# Patient Record
Sex: Male | Born: 1990 | Race: Black or African American | Hispanic: No | Marital: Single | Smoking: Former smoker
Health system: Southern US, Community
[De-identification: ages and names within clinical notes are randomized; demographics above are authoritative.]

## PROBLEM LIST (undated history)

## (undated) DIAGNOSIS — J45909 Unspecified asthma, uncomplicated: Secondary | ICD-10-CM

## (undated) DIAGNOSIS — E119 Type 2 diabetes mellitus without complications: Secondary | ICD-10-CM

## (undated) DIAGNOSIS — I10 Essential (primary) hypertension: Secondary | ICD-10-CM

## (undated) HISTORY — PX: EYE SURGERY: SHX253

---
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
## 2011-04-14 NOTE — Discharge Summary (Signed)
[place]. [facility]  Patient:    Keith Maynard, Keith Maynard                    MRN: [id number] Adm. Date:  [id number] Disc. Date: [id number] Attending:  McDiarmid, Leighton Roach. Dictator:   Cheree Ditto, M.D. CC:         Keith Maynard, M.D.   Discharge Summary  DISCHARGE DIAGNOSES: 1. Asthma exacerbation, improved. 2. Hyperglycemia, resolved. 3. Mental status changes, resolved.  DISCHARGE MEDICATIONS: 1. Flovent 44 mcg two puffs b.i.d. 2. Albuterol one to two puffs q.6h. p.r.n.  PROCEDURES:  None.  CONSULTS:  None.  HISTORY AND PHYSICAL:  Please see dictated history and physical from December 01, 2000.  In short, Keith Maynard is a 20-year-old African-American male with a history of asthma on chronic inhaled corticosteroids who presented to the emergency department with difficulty breathing and a question of unresponsiveness.  HOSPITAL COURSE:  #1 - ASTHMA EXACERBATION:  Keith Maynard received Solu-Medrol in the emergency department.  He was continued on his Flovent inhalers during hospitalization with albuterol nebulizers p.r.n.  He had good room air oxygen saturations on the day of discharge.  It was felt like his breathing was at its baseline.  Because he was doing so well, he was not continued on steroids during hospitalization or upon discharge.  He was instructed to use Flovent two puffs twice a day until follow up with his primary care physician in one to two weeks.  #2 - HYPERGLYCEMIA:  Keith Maynard had a blood glucose of 281 on admission.  This was felt to be secondary to the steroids that he received in the ER as well as the stress of his illness.  CBGs were followed during hospitalization and they normalized.  The morning of his discharge his CBG was 91.  He had no signs of DKA.  #3 - MENTAL STATUS CHANGES:  Keith Maynard presented with altered responsiveness but this quickly improved with treatment of his asthma exacerbation and he was acting like his normal self on  the day of hospital discharge.  DISPOSITION AND FOLLOW UP:  The patient was discharged in much improved condition with follow up planned with Dr. Mikel Cella at [facility] in one to two weeks. DD:  12/02/00 TD:  12/02/00 Job: [id number]

## 2011-04-14 NOTE — H&P (Signed)
[place]. [facility]  Patient:    Keith Maynard, Keith Maynard                      MRN: [id number] Adm. Date:  12/01/00 Attending:  Gaynell Face L. Deirdre Priest, M.D. Dictator:   Talmage Nap, M.D.                         History and Physical  ADMISSION DIAGNOSES:  1. Asthma exacerbation.  2. Question of hyperglycemia.  HISTORY OF PRESENT ILLNESS: Alferd is a very nice [age]-year-old male with a history of asthma since birth.  He has been treated at [facility].  The father reports the patient had multiple hospitalizations when he was young but has had three since the age of [age].  The father reports that approximately one week ago Markale began developing cold symptoms.  He was having a lot of rhinorrhea and had a dry cough.  On Wednesday Nasim developed some worsening of his asthma and was having difficulty breathing with significant shortness of breath and continued dry cough.  At school he was unable to use his MDIs and he was sent home.  Since then he has been receiving albuterol nebulizers at home about every six hours.  Today Keymon developed significantly worsening shortness of breath this evening.  The father reports Kymir was using albuterol nebulizers q.4h without relief.  At approximately midnight the patient developed significant worsening of shortness of breath and he was getting extremely lethargic, and was thought to be minimally responsive by EMS at approximately 1:30 a.m.  En route EMS reports that Vladimir was minimally responsive to pain.  He was given an epinephrine subcu 1:1000 injection.  He had some relief with this and became more responsive.  He was then given an epinephrine nebulizer with continued improvement.  The patients oxygen saturation was originally 78% on room air and improved to 95% on face mask upon arrival.  The father and Red report no significant fever since Wednesday.  He has had some chills.  He has  had significant shortness of breath and increased work of breathing.  He did vomit one time on Wednesday.  He has not had any diarrhea.  The father reports Charod has had good p.o. intake.  Shammond reports he is somewhat shaky currently but feels like he is breathing better.  PAST MEDICAL HISTORY:  1. Premature delivery.  2. Asthma.  3. Hospitalized multiple times in the past without intubation.  MEDICATIONS:  1. Albuterol nebulizer q.4h p.r.n.  2. Flovent 44 mcg 2 puffs q.d.  3. Over-the-counter cold medicine.  ALLERGIES: No known drug allergies.  SOCIAL HISTORY: Sebastyan lives with his father and 65 year old sister.  He is currently in third grade.  His father does smoke at home.  FAMILY HISTORY: Negative for asthma.  PHYSICAL EXAMINATION:  VITAL SIGNS: Currently temperature is 97.3 degrees.  Blood pressure 119/82, heart rate 117, respiratory rate 36.  Oxygen saturation 100% on 10 liter face mask.  Current oxygen saturation 96% on 2 liters nasal cannula oxygen.  GENERAL: The patient is a somewhat tired and quiet male, who is in no distress but is not speaking much.  HEENT: PERRL.  EOMI.  No conjunctival injection.  TMs are clear bilaterally. Mouth reveals slightly dry mucous membranes but the throat is pink without erythema or exudate.  NECK: Supple, with mild shotty adenopathy bilaterally.  There is no thyromegaly.  CHEST: Good aeration, with end  expiratory wheezes diffusely.  No crackles or rhonchi.  CARDIOVASCULAR: Slightly tachycardic rate, with normal rhythm.  There is no murmur noted.  ABDOMEN: Slightly overweight, with normal bowel sounds.  No significant tenderness or distention.  No hepatosplenomegaly.  No masses.  EXTREMITIES: No clubbing, cyanosis, or edema.  Capillary refill time two seconds.  NEUROLOGIC: The patient is a tired but responsive male, who will answer questions with one word; otherwise there are no focal deficits.  LABORATORY DATA: WBC  10.2, hemoglobin 13.8; platelets 396,000; ANC 4.6.  On an i-STAT sodium was 141, potassium 4.5, chloride 106, bicarbonate 27, BUN 13, creatinine 1.2, glucose 281.  Chest x-ray reveals hyperinflation.  ASSESSMENT/PLAN:  1. Asthma exacerbation.  The patient presents with severe exacerbation and     decreased responsiveness with hypoxia per EMS.  The patient currently     appears stable.  He was given epinephrine subcu and nebulizer treatment.     The patient was given Solu-Medrol and albuterol here in the emergency     department and we will continue q.2h albuterol nebulizers.  We will hold     on further steroids given hyperglycemia on questionable i-STAT and will     check an arterial blood gas.  2. Hyperglycemia.  The patients sugar was 281 on i-STAT.  We will check a     CBG now and will check a stat arterial blood gas, CMET, serum ketones, and     urine.  If the patient does continue to be hyperglycemic we will need to     check a C peptide and a hemoglobin A1C to determine the type and     significant amount of hyperglycemia he does have.  3. Question renal dysfunction.  The patients creatinine was 1.2 on i-STAT.     We will check a CMET to reevaluate this. DD:  12/01/00 TD:  12/01/00 Job: [id number]

## 2011-05-14 ENCOUNTER — Observation Stay (HOSPITAL_COMMUNITY)
Admission: EM | Admit: 2011-05-14 | Discharge: 2011-05-14 | Disposition: A | Discharge status: Home / Self Care | Payer: Medicaid Other | Attending: Emergency Medicine | Admitting: Emergency Medicine

## 2011-05-14 DIAGNOSIS — J45909 Unspecified asthma, uncomplicated: Principal | ICD-10-CM | POA: Insufficient documentation

## 2014-02-07 ENCOUNTER — Encounter (HOSPITAL_COMMUNITY): Payer: Self-pay | Admitting: Emergency Medicine

## 2014-02-07 DIAGNOSIS — J45909 Unspecified asthma, uncomplicated: Secondary | ICD-10-CM | POA: Insufficient documentation

## 2014-02-07 DIAGNOSIS — Y929 Unspecified place or not applicable: Secondary | ICD-10-CM | POA: Insufficient documentation

## 2014-02-07 DIAGNOSIS — T628X1A Toxic effect of other specified noxious substances eaten as food, accidental (unintentional), initial encounter: Secondary | ICD-10-CM | POA: Insufficient documentation

## 2014-02-07 DIAGNOSIS — Z79899 Other long term (current) drug therapy: Secondary | ICD-10-CM | POA: Insufficient documentation

## 2014-02-07 DIAGNOSIS — J3489 Other specified disorders of nose and nasal sinuses: Secondary | ICD-10-CM | POA: Insufficient documentation

## 2014-02-07 DIAGNOSIS — R109 Unspecified abdominal pain: Secondary | ICD-10-CM | POA: Insufficient documentation

## 2014-02-07 DIAGNOSIS — Y9389 Activity, other specified: Secondary | ICD-10-CM | POA: Insufficient documentation

## 2014-02-07 DIAGNOSIS — R110 Nausea: Secondary | ICD-10-CM | POA: Insufficient documentation

## 2014-02-07 NOTE — ED Notes (Signed)
Pt. reports allergic reaction after eating nuts this evening states nasal congestion , headache and abdominal cramping , no nausea or vomitting , airway intact / respirations unlabored .

## 2014-02-08 ENCOUNTER — Emergency Department (HOSPITAL_COMMUNITY)
Admission: EM | Admit: 2014-02-08 | Discharge: 2014-02-08 | Disposition: A | Discharge status: Home / Self Care | Payer: Medicaid Other | Attending: Emergency Medicine | Admitting: Emergency Medicine

## 2014-02-08 DIAGNOSIS — T7840XA Allergy, unspecified, initial encounter: Secondary | ICD-10-CM

## 2014-02-08 MED ORDER — DIPHENHYDRAMINE HCL 25 MG PO TABS: 25.0000 mg | ORAL_TABLET | Freq: Four times a day (QID) | ORAL | Status: DC | PRN

## 2014-02-08 MED ORDER — METHYLPREDNISOLONE SODIUM SUCC 125 MG IJ SOLR
125.0000 mg | Freq: Once | INTRAMUSCULAR | Status: AC
  Administered 2014-02-08: 125 mg via INTRAVENOUS
  Filled 2014-02-08: qty 2

## 2014-02-08 MED ORDER — SODIUM CHLORIDE 0.9 % IV BOLUS (SEPSIS)
1000.0000 mL | Freq: Once | INTRAVENOUS | Status: AC
  Administered 2014-02-08: 1000 mL via INTRAVENOUS

## 2014-02-08 MED ORDER — ONDANSETRON HCL 4 MG/2ML IJ SOLN
4.0000 mg | Freq: Once | INTRAMUSCULAR | Status: AC
  Administered 2014-02-08: 4 mg via INTRAVENOUS
  Filled 2014-02-08: qty 2

## 2014-02-08 MED ORDER — EPINEPHRINE 0.3 MG/0.3ML IJ SOAJ
0.3000 mg | Freq: Once | INTRAMUSCULAR | Status: DC | PRN
Start: 2014-02-08 — End: 2015-09-15

## 2014-02-08 MED ORDER — DIPHENHYDRAMINE HCL 50 MG/ML IJ SOLN
25.0000 mg | Freq: Once | INTRAMUSCULAR | Status: AC
  Administered 2014-02-08: 25 mg via INTRAVENOUS
  Filled 2014-02-08: qty 1

## 2014-02-08 NOTE — ED Provider Notes (Signed)
CSN: [id number]     Arrival date & time 02/07/14  2319 History   First MD Initiated Contact with Patient 02/08/14 0025     Chief Complaint  Patient presents with  . Allergic Reaction     (Consider location/radiation/quality/duration/timing/severity/associated sxs/prior Treatment) HPI Comments: Patient is [name] PMHx significant for asthma and allergies presenting to the ED for an allergic reaction after accidentally eating a pecan about an hour PTA. Patient states he developed nasal congestion, rhinorrhea, abdominal cramping, nausea. The patient states that he did not take anything for his symptoms, but they have improved drastically while being in ED. He does still endorse some mild nausea and rhinorrhea, but denies any SOB, oropharyngeal swelling/edema, drooling, stridor. Patient states he was hospitalized in high school for an allergic reaction, but did not require intubation. He does not have an EpiPen at home.   Patient is a 23 y.o. male presenting with allergic reaction.  Allergic Reaction Presenting symptoms: no difficulty swallowing and no wheezing     History reviewed. No pertinent past medical history. History reviewed. No pertinent past surgical history. No family history on file. History  Substance Use Topics  . Smoking status: Never Smoker   . Smokeless tobacco: Not on file  . Alcohol Use: No    Review of Systems  Constitutional: Negative for fever and chills.  HENT: Positive for congestion, rhinorrhea and sneezing. Negative for facial swelling, sore throat, trouble swallowing and voice change.   Respiratory: Negative for shortness of breath, wheezing and stridor.   Gastrointestinal: Positive for nausea and abdominal pain. Negative for vomiting.  All other systems reviewed and are negative.      Allergies  Other  Home Medications   Current Outpatient Rx  Name  Route  Sig  Dispense  Refill  . albuterol (PROVENTIL,VENTOLIN) 90 MCG/ACT inhaler      2 puffs  every 4 hours as needed for cough and wheezing and shortness of breath           BP 126/82  Pulse 65  Temp(Src) 98.3 F (36.8 C) (Oral)  Resp 14  Ht 5\' 7"  (1.702 m)  Wt 125 lb (56.7 kg)  BMI 19.57 kg/m2  SpO2 97% Physical Exam  Constitutional: He is oriented to person, place, and time. He appears well-developed and well-nourished. No distress.  HENT:  Head: Normocephalic and atraumatic.  Right Ear: External ear normal.  Left Ear: External ear normal.  Nose: Nose normal.  Mouth/Throat: Uvula is midline, oropharynx is clear and moist and mucous membranes are normal. No trismus in the jaw. No uvula swelling. No oropharyngeal exudate, posterior oropharyngeal edema, posterior oropharyngeal erythema or tonsillar abscesses.  No angioedema.   Eyes: Conjunctivae are normal.  Neck: Normal range of motion. Neck supple.  Cardiovascular: Normal rate, regular rhythm, normal heart sounds and intact distal pulses.   Pulmonary/Chest: Effort normal and breath sounds normal. No accessory muscle usage or stridor. Not tachypneic and not bradypneic. No respiratory distress. He has no decreased breath sounds. He has no wheezes. He has no rhonchi. He has no rales. He exhibits no tenderness.  Abdominal: Soft. Bowel sounds are normal. He exhibits no distension. There is no tenderness. There is no rebound and no guarding.  Musculoskeletal: Normal range of motion.  Lymphadenopathy:    He has no cervical adenopathy.  Neurological: He is alert and oriented to person, place, and time.  Skin: Skin is warm and dry. He is not diaphoretic.  Psychiatric: He has a normal  mood and affect.    ED Course  Procedures (including critical care time) Medications  sodium chloride 0.9 % bolus 1,000 mL (1,000 mLs Intravenous New Bag/Given 02/08/14 0056)  ondansetron (ZOFRAN) injection 4 mg (4 mg Intravenous Given 02/08/14 0054)  methylPREDNISolone sodium succinate (SOLU-MEDROL) 125 mg/2 mL injection 125 mg (125 mg  Intravenous Given 02/08/14 0054)  diphenhydrAMINE (BENADRYL) injection 25 mg (25 mg Intravenous Given 02/08/14 0054)    Labs Review Labs Reviewed - No data to display Imaging Review No results found.   EKG Interpretation None      MDM   Final diagnoses:  Allergic reaction    Filed Vitals:   02/07/14 2330  BP: 126/82  Pulse: 65  Temp: 98.3 F (36.8 C)  Resp: 14    No evidence of facial, oropharyngeal swelling on initial assessment. Lungs clear. RRR nml S1S2. Denies any SOB, stridor, wheezing. Endorses mild nausea and rhinorrhea still. IVF, Solumedrol, Benadryl and Zofran ordered. Plan to watch patient for any rebound reaction, disposition pending how patient does with hopeful DC home. Patient d/w with Dr. Norlene Campbelltter, agrees with plan and who will reassess patient.      Jeannetta EllisJennifer L Shanell Aden, PA-C 02/08/14 0158

## 2014-02-08 NOTE — ED Notes (Signed)
Pt denies nausea, difficulty breathing. States some symptoms have resolved. Scratchy throat gone, nausea resolved. Dull pain in stomach left.

## 2014-02-08 NOTE — ED Provider Notes (Signed)
Medical screening examination/treatment/procedure(s) were performed by non-physician practitioner and as supervising physician I was immediately available for consultation/collaboration.   EKG Interpretation None       Dan Dissinger M Cornelis Kluver, MD 02/08/14 0227 

## 2014-02-08 NOTE — ED Notes (Signed)
PA at bedside.

## 2014-02-08 NOTE — Discharge Instructions (Signed)
Please follow up with your primary care physician in 1-2 days. If you do not have one please call the [place] number listed above. Please use EpiPen as directed below. Please take Benadryl as rpescribed as needed. Please read all discharge instructions and return precautions.    Epinephrine injection (Auto-injector) What is this medicine? EPINEPHRINE (ep i NEF rin) is used for the emergency treatment of severe allergic reactions. You should keep this medicine with you at all times. This medicine may be used for other purposes; ask your health care provider or pharmacist if you have questions. COMMON BRAND NAME(S): Adrenaclick, Auvi-Q, EpiPen, Twinject What should I tell my health care provider before I take this medicine? They need to know if you have any of the following conditions: -diabetes -heart disease -high blood pressure -lung or breathing disease, like asthma -Parkinson's disease -thyroid disease -an unusual or allergic reaction to epinephrine, sulfites, other medicines, foods, dyes, or preservatives -pregnant or trying to get pregnant -breast-feeding How should I use this medicine? This medicine is for injection into the outer thigh. Your doctor or health care professional will instruct you on the proper use of the device during an emergency. Read all directions carefully and make sure you understand them. Do not use more often than directed. Talk to your pediatrician regarding the use of this medicine in children. Special care may be needed. This drug is commonly used in children. A special device is available for use in children. Overdosage: If you think you have taken too much of this medicine contact a poison control center or emergency room at once. NOTE: This medicine is only for you. Do not share this medicine with others. What if I miss a dose? This does not apply. You should only use this medicine for an allergic reaction. What may interact with  this medicine? This medicine is only used during an emergency. Significant drug interactions are not likely during emergency use. This list may not describe all possible interactions. Give your health care provider a list of all the medicines, herbs, non-prescription drugs, or dietary supplements you use. Also tell them if you smoke, drink alcohol, or use illegal drugs. Some items may interact with your medicine. What should I watch for while using this medicine? Keep this medicine ready for use in the case of a severe allergic reaction. Make sure that you have the phone number of your doctor or health care professional and local hospital ready. Remember to check the expiration date of your medicine regularly. You may need to have additional units of this medicine with you at work, school, or other places. Talk to your doctor or health care professional about your need for extra units. Some emergencies may require an additional dose. Check with your doctor or a health care professional before using an extra dose. After use, go to the nearest hospital or call 911. Avoid physical activity. Make sure the treating health care professional knows you have received an injection of this medicine. You will receive additional instructions on what to do during and after use of this medicine before a medical emergency occurs. What side effects may I notice from receiving this medicine? Side effects that you should report to your doctor or health care professional as soon as possible: -allergic reactions like skin rash, itching or hives, swelling of the face, lips, or tongue -breathing problems -chest pain -flushing -irregular or pounding heartbeat -numbness in fingers or toes -vomiting Side effects that usually do not  require medical attention (report to your doctor or health care professional if they continue or are bothersome): -anxiety or nervousness -dizzy, drowsy -dry mouth -headache -increased  sweating -nausea -tired, weak This list may not describe all possible side effects. Call your doctor for medical advice about side effects. You may report side effects to FDA at 1-800-FDA-1088. Where should I keep my medicine? Keep out of the reach of children. Store at room temperature between 15 and 30 degrees C (59 and 86 degrees F). Protect from light and heat. The solution should be clear in color. If the solution is discolored or contains particles it must be replaced. Throw away any unused medicine after the expiration date. Ask your doctor or pharmacist about proper disposal of the injector if it is expired or has been used. Always replace your auto-injector before it expires. NOTE: This sheet is a summary. It may not cover all possible information. If you have questions about this medicine, talk to your doctor, pharmacist, or health care provider.  2014, Elsevier/Gold Standard. (2013-03-24 14:59:01) Anaphylactic Reaction An anaphylactic reaction is a sudden, severe allergic reaction that involves the whole body. It can be life threatening. A hospital stay is often required. People with asthma, eczema, or hay fever are slightly more likely to have an anaphylactic reaction. CAUSES  An anaphylactic reaction may be caused by anything to which you are allergic. After being exposed to the allergic substance, your immune system becomes sensitized to it. When you are exposed to that allergic substance again, an allergic reaction can occur. Common causes of an anaphylactic reaction include:  Medicines.  Foods, especially peanuts, wheat, shellfish, milk, and eggs.  Insect bites or stings.  Blood products.  Chemicals, such as dyes, latex, and contrast material used for imaging tests. SYMPTOMS  When an allergic reaction occurs, the body releases histamine and other substances. These substances cause symptoms such as tightening of the airway. Symptoms often develop within seconds or minutes of  exposure. Symptoms may include:  Skin rash or hives.  Itching.  Chest tightness.  Swelling of the eyes, tongue, or lips.  Trouble breathing or swallowing.  Lightheadedness or fainting.  Anxiety or confusion.  Stomach pains, vomiting, or diarrhea.  Nasal congestion.  A fast or irregular heartbeat (palpitations). DIAGNOSIS  Diagnosis is based on your history of recent exposure to allergic substances, your symptoms, and a physical exam. Your caregiver may also perform blood or urine tests to confirm the diagnosis. TREATMENT  Epinephrine medicine is the main treatment for an anaphylactic reaction. Other medicines that may be used for treatment include antihistamines, steroids, and albuterol. In severe cases, fluids and medicine to support blood pressure may be given through an intravenous line (IV). Even if you improve after treatment, you need to be observed to make sure your condition does not get worse. This may require a stay in the hospital. [place] a medical alert bracelet or necklace stating your allergy.  You and your family must learn how to use an anaphylaxis kit or give an epinephrine injection to temporarily treat an emergency allergic reaction. Always carry your epinephrine injection or anaphylaxis kit with you. This can be lifesaving if you have a severe reaction.  Do not drive or perform tasks after treatment until the medicines used to treat your reaction have worn off, or until your caregiver says it is okay.  If you have hives or a rash:  Take medicines as directed by your caregiver.  You may  use an over-the-counter antihistamine (diphenhydramine) as needed.  Apply cold compresses to the skin or take baths in cool water. Avoid hot baths or showers. SEEK MEDICAL CARE IF:   You develop symptoms of an allergic reaction to a new substance. Symptoms may start right away or minutes later.  You develop a rash, hives, or itching.  You develop  new symptoms. SEEK IMMEDIATE MEDICAL CARE IF:   You have swelling of the mouth, difficulty breathing, or wheezing.  You have a tight feeling in your chest or throat.  You develop hives, swelling, or itching all over your body.  You develop severe vomiting or diarrhea.  You feel faint or pass out. This is an emergency. Use your epinephrine injection or anaphylaxis kit as you have been instructed. Call your local emergency services (911 in [place] Even if you improve after the injection, you need to be examined at a hospital emergency department. MAKE SURE YOU:   Understand these instructions.  Will watch your condition.  Will get help right away if you are not doing well or get worse. Document Released: 11/13/2005 Document Revised: 05/14/2012 Document Reviewed: 02/14/2012 [facility] Patient Information 2014 [place], [place].

## 2015-04-01 ENCOUNTER — Encounter (HOSPITAL_COMMUNITY): Payer: Self-pay | Admitting: Emergency Medicine

## 2015-04-01 ENCOUNTER — Emergency Department (HOSPITAL_COMMUNITY)
Admission: EM | Admit: 2015-04-01 | Discharge: 2015-04-01 | Disposition: A | Discharge status: Home / Self Care | Payer: Medicaid Other | Attending: Emergency Medicine | Admitting: Emergency Medicine

## 2015-04-01 DIAGNOSIS — R0600 Dyspnea, unspecified: Secondary | ICD-10-CM

## 2015-04-01 DIAGNOSIS — Z7952 Long term (current) use of systemic steroids: Secondary | ICD-10-CM | POA: Insufficient documentation

## 2015-04-01 DIAGNOSIS — J45901 Unspecified asthma with (acute) exacerbation: Secondary | ICD-10-CM

## 2015-04-01 DIAGNOSIS — J45909 Unspecified asthma, uncomplicated: Secondary | ICD-10-CM | POA: Insufficient documentation

## 2015-04-01 DIAGNOSIS — Z79899 Other long term (current) drug therapy: Secondary | ICD-10-CM | POA: Insufficient documentation

## 2015-04-01 HISTORY — DX: Unspecified asthma, uncomplicated: J45.909

## 2015-04-01 MED ORDER — ALBUTEROL SULFATE HFA 108 (90 BASE) MCG/ACT IN AERS
2.0000 | INHALATION_SPRAY | Freq: Once | RESPIRATORY_TRACT | Status: AC
  Administered 2015-04-01: 2 via RESPIRATORY_TRACT
  Filled 2015-04-01: qty 6.7

## 2015-04-01 MED ORDER — PREDNISONE 20 MG PO TABS: 60.0000 mg | ORAL_TABLET | Freq: Every day | ORAL | Status: DC

## 2015-04-01 NOTE — Progress Notes (Signed)
[facility] Keith DutyFelicia Maynard Community Health & Eligibility Specialist [phone]  LCSW Keith RasmussenHannah Maynard consulted me about this patient regarding primary care options and [facility]. Spoke with patient regarding these concerns. Patient and mother at bedside states the patient's medicaid has expired and patient is in need of medication and a primary care provider. Patient states he has been seen in the past and [facility]. I called to see if patient could re-established with the clinic, patient was put on the wait list. Patient will be notified by the practice via mail once an appointment becomes available. [facility] this transition. [facility],2016 at 8:30am to obtain the orange card and apply for financial assistance. Patient verbalized understanding and active in this plan. Orange Physicist, medicalcard application and resource guide provided. Patient also given my contact information for any future questions or concerns. No other needs identified at this time.

## 2015-04-01 NOTE — Discharge Instructions (Signed)

## 2015-04-01 NOTE — Clinical Social Work Note (Signed)
Clinical Social Work Assessment  Patient Details  Name: Keith Maynard MRN: [id number] Date of Birth: June 11, 1991  Date of referral:  04/01/15               Reason for consult:  Financial Concerns, Medication Concerns, Other (Comment Required) (Access to Care)                Permission sought to share information with:  Case Manager, Customer service manager Permission granted to share information::  Yes, Verbal Permission Granted  Name::     patient mother at the bedside  Agency::  [facility], [facility]  Relationship::  Mother, community supports  Sport and exercise psychologist Information:  Yes  Housing/Transportation Living arrangements for the past 2 months:  [place] of Information:  Patient, Parent Patient Interpreter Needed:  None Criminal Activity/Legal Involvement Pertinent to Current Situation/Hospitalization:  No - Comment as needed Significant Relationships:  Parents Lives with:  Parents Do you feel safe going back to the place where you live?  Yes Need for family participation in patient care:  Yes (Comment) (just for additional support)  Care giving concerns:  No care giving concerns at this time, patient has all basic needs met   Social Worker assessment / plan:  LCSW received call and consult from MD regarding access to care and needing insurance to help pay for his medication and have follow up. Patient had medicaid when he was 24 years old, but then aged out of the system and was unable to received medicaid as he applied, but was denied. Mother in patient room reports he was active in follow up, but when he lost his insurance it has been sporadic with follow up. LCSW discussed options regarding [facility], [facility], and [facility].  LCSW obtained permission to have representative from [facility] come and give application and follow up instructions in which they were both receptive. LCSW will speak with CM about [place] letter for his albuterol  as they cannot afford the medication.  Patient has not used this resource this year or in the past.  Patient will DC home with mother as support and no other needs at this time identified.  Employment status:  Unemployed Forensic scientist:  Self Pay (Medicaid Pending) PT Recommendations:  Not assessed at this time, No Follow Up Information / Referral to community resources:  Other (Comment Required) ([facility], [facility])  Patient/Family's Response to care:  Patient and mother both agreeable to referrals for access to care: [facility] and visit from the representative from [facility] in effort to   Patient/Family's Understanding of and Emotional Response to Diagnosis, Current Treatment, and Prognosis:  Mother and patient both agreeable to plan and understanding of follow up with community resources discussed.  Mother reports patient was a premie and has had asthma since birth and struggled. Patient is currently trying to find a job, has been under a lot of stress, causing exacerbation of his asthma and attacks.  Mother hopeful for orange card along with patient and appreciative of referrals.   Emotional Assessment Appearance:  Appears stated age Attitude/Demeanor/Rapport:  Other (Approrpiate and Cooperative) Affect (typically observed):  Accepting, Quiet Orientation:  Oriented to Self, Oriented to Place, Oriented to  Time, Oriented to Situation Alcohol / Substance use:  Not Applicable Psych involvement (Current and /or in the community):  No (Comment)  Discharge Needs  Concerns to be addressed:  Medication Concerns, Financial / Insurance Concerns Readmission within the last  30 days:  No Current discharge risk:  None Barriers to Discharge:  Barriers Resolved   Lilly [place], LCSW 04/01/2015, 8:15 AM

## 2015-04-01 NOTE — ED Provider Notes (Signed)
CSN: [id number]     Arrival date & time 04/01/15  [id number] History   First MD Initiated Contact with Patient 04/01/15 [phone]     Chief Complaint  Patient presents with  . Asthma  . Chest Pain     (Consider location/radiation/quality/duration/timing/severity/associated sxs/prior Treatment) Patient is a 24 y.o. male presenting with asthma.  Asthma This is a new problem. Episode onset: 2 days ago. The problem occurs constantly. The problem has not changed since onset.Nothing aggravates the symptoms. Nothing relieves the symptoms. He has tried nothing for the symptoms.    Past Medical History  Diagnosis Date  . Asthma    History reviewed. No pertinent past surgical history. History reviewed. No pertinent family history. History  Substance Use Topics  . Smoking status: Never Smoker   . Smokeless tobacco: Not on file  . Alcohol Use: No    Review of Systems  All other systems reviewed and are negative.     Allergies  Other  Home Medications   Prior to Admission medications   Medication Sig Start Date End Date Taking? Authorizing Provider  albuterol (PROVENTIL,VENTOLIN) 90 MCG/ACT inhaler 2 puffs every 4 hours as needed for cough and wheezing and shortness of breath    Yes Historical Provider, MD  diphenhydrAMINE (BENADRYL) 25 MG tablet Take 1 tablet (25 mg total) by mouth every 6 (six) hours as needed for itching or allergies. 02/08/14  Yes Jennifer Piepenbrink, PA-C  EPINEPHrine (EPIPEN 2-PAK) 0.3 mg/0.3 mL SOAJ injection Inject 0.3 mLs (0.3 mg total) into the muscle once as needed (for severe allergic reaction). CAll 911 immediately if you have to use this medicine 02/08/14   Francee PiccoloJennifer Piepenbrink, PA-C  predniSONE (DELTASONE) 20 MG tablet Take 3 tablets (60 mg total) by mouth daily. 04/01/15   Mirian MoMatthew Gentry, MD   BP 127/74 mmHg  Pulse 96  Temp(Src) 97.7 F (36.5 C) (Oral)  Resp 14  SpO2 98% Physical Exam  Constitutional: He is oriented to person, place, and time. He appears  well-developed and well-nourished.  HENT:  Head: Normocephalic and atraumatic.  Eyes: Conjunctivae and EOM are normal.  Neck: Normal range of motion. Neck supple.  Cardiovascular: Normal rate, regular rhythm and normal heart sounds.   Pulmonary/Chest: Effort normal and breath sounds normal. No respiratory distress.  Abdominal: He exhibits no distension. There is no tenderness. There is no rebound and no guarding.  Musculoskeletal: Normal range of motion.  Neurological: He is alert and oriented to person, place, and time.  Skin: Skin is warm and dry.  Vitals reviewed.   ED Course  Procedures (including critical care time) Labs Review Labs Reviewed - No data to display  Imaging Review No results found.   EKG Interpretation   Date/Time:  Thursday Apr 01 2015 07:45:12 EDT Ventricular Rate:  97 PR Interval:  146 QRS Duration: 96 QT Interval:  353 QTC Calculation: 448 R Axis:   59 Text Interpretation:  Sinus rhythm No significant change since last  tracing Confirmed by Mirian MoGentry, Matthew [phone]([id number]) on 04/01/2015 9:08:00 AM      MDM   Final diagnoses:  Dyspnea  Asthma attack    24 y.o. male with pertinent PMH of asthma presents with dyspnea, wheezing, chest tightness from wheezing consistent with prior asthma attack. No fevers, productive cough, or other signs of PNA or infection.  Pt has very mild dyspnea at time of exam.  Given albuterol.  Social work consulted as pt medicaid lapsed and he has no way of affording albuterol.  Social work has arranged for fu.  DC home in stable condition with prescription for prednisone.  I have reviewed all laboratory and imaging studies if ordered as above  1. Dyspnea   2. Asthma attack         Mirian MoMatthew Gentry, MD 04/01/15 [phone]

## 2015-04-01 NOTE — Discharge Planning (Signed)
Talked to pt at bedside. Chart reviewed. Spoke with pt concerning his medications and not being able to afford them.  Explained the MATCH- Medication Assistance program to pt. [facility] is only a one time in one year from the date of discharge to next year. Pt chose to decline MATCH at this time; [facility] or [facility] and apply for [employer].

## 2015-04-01 NOTE — ED Notes (Addendum)
Pt as asthma but no doctor; has rescue inhaler but no breathing tx machine at home. States he has had 5 today. Sats 100% and no signs of respiratory distress. Chest pain associated with asthma attack. Pain resolves after attack. Flair up of asthma started yesterday. Reports he is out of inhaler too. Family wants to talk with SW about getting provider and some medicine.

## 2015-09-15 ENCOUNTER — Ambulatory Visit (INDEPENDENT_AMBULATORY_CARE_PROVIDER_SITE_OTHER): Payer: Self-pay | Admitting: Family Medicine

## 2015-09-15 VITALS — BP 116/80 | HR 94 | Temp 97.8°F | Resp 16 | Ht 68.0 in | Wt 119.0 lb

## 2015-09-15 DIAGNOSIS — J0110 Acute frontal sinusitis, unspecified: Secondary | ICD-10-CM

## 2015-09-15 MED ORDER — AMOXICILLIN-POT CLAVULANATE 875-125 MG PO TABS: 1.0000 | ORAL_TABLET | Freq: Two times a day (BID) | ORAL | Status: DC

## 2015-09-15 MED ORDER — PREDNISONE 20 MG PO TABS: ORAL_TABLET | ORAL | Status: DC

## 2015-09-15 MED ORDER — PHENYLEPH-PROMETHAZINE-COD 5-6.25-10 MG/5ML PO SYRP: 10.0000 mL | ORAL_SOLUTION | Freq: Four times a day (QID) | ORAL | Status: DC | PRN

## 2015-09-15 NOTE — Patient Instructions (Signed)

## 2015-09-15 NOTE — Progress Notes (Signed)
Subjective:  This chart was scribed for Keith Sorenson, MD by Stann Ore, Medical Scribe. This patient was seen in Room 11 and the patient's care was started 2:28 PM.    Patient ID: Keith Maynard, male    DOB: July 26, 1991, 24 y.o.   MRN: [id number] Chief Complaint  Patient presents with  . Sinus Problem    x 1 week   . Headache    x 3 days     HPI Keith Maynard is a 24 y.o. male who presents to [facility] complaining of sinus problems for a week now.  He also noticed a headaches located behind his left eye starting 3 days ago. He used aspirin for his headaches for mild relief. He used nasal spray for temporary relief. He has lack of sleep. He denies previous sinus infections. He's only needing to use his inhaler once. He denies being on antibiotics recently. He denies knowledge of antibiotic allergies. He denies fever, chills, wheezes.   He has work later in the afternoon. He works in [employer].   Past Medical History  Diagnosis Date  . Asthma    Prior to Admission medications   Medication Sig Start Date End Date Taking? Authorizing Provider  albuterol (PROVENTIL,VENTOLIN) 90 MCG/ACT inhaler 2 puffs every 4 hours as needed for cough and wheezing and shortness of breath    Yes Historical Provider, MD   Allergies  Allergen Reactions  . Other Shortness Of Breath    All nuts except peanuts-itching,nausea    Review of Systems  Constitutional: Negative for fever and chills.  HENT: Positive for rhinorrhea and sinus pressure.   Respiratory: Negative for cough and wheezing.   Cardiovascular: Negative for chest pain.  Gastrointestinal: Negative for nausea, vomiting, diarrhea and constipation.  Neurological: Positive for headaches. Negative for dizziness.  Psychiatric/Behavioral: Positive for sleep disturbance.       Objective:   Physical Exam  Constitutional: He is oriented to person, place, and time. He appears well-developed and well-nourished. No distress.  HENT:  Head:  Normocephalic and atraumatic.  Right Ear: A middle ear effusion is present.  Left Ear: A middle ear effusion is present.  Nose: Rhinorrhea present.  Severe nasal rhinitis  Eyes: EOM are normal. Pupils are equal, round, and reactive to light.  Neck: Neck supple.  Cardiovascular: Regular rhythm, S1 normal and S2 normal.  Tachycardia present.   Pulmonary/Chest: Effort normal and breath sounds normal. No respiratory distress. He has no wheezes.  Musculoskeletal: Normal range of motion.  Neurological: He is alert and oriented to person, place, and time.  Skin: Skin is warm and dry.  Psychiatric: He has a normal mood and affect. His behavior is normal.  Nursing note and vitals reviewed.   BP 116/80 mmHg  Pulse 94  Temp(Src) 97.8 F (36.6 C) (Oral)  Resp 16  Ht  (1.727 m)  Wt 119 lb (53.978 kg)  BMI 18.10 kg/m2  SpO2 99%       Assessment & Plan:  By signing my name below, I, Stann Ore, attest that this documentation has been prepared under the direction and in the presence of Keith Sorenson, MD. Electronically Signed: Stann Ore, Scribe. 09/15/2015 , 2:28 PM .  1. Acute frontal sinusitis, recurrence not specified     Meds ordered this encounter  Medications  . DISCONTD: amoxicillin-clavulanate (AUGMENTIN) 875-125 MG tablet    Sig: Take 1 tablet by mouth 2 (two) times daily.    Dispense:  20 tablet  Refill:  0  . Phenyleph-Promethazine-Cod (PROMETHAZINE VC/CODEINE) 5-6.25-10 MG/5ML SYRP    Sig: Take 10 mLs by mouth every 6 (six) hours as needed (headache, cough, congestion, sleep).    Dispense:  180 mL    Refill:  0    Ok to dispense the same sig and amount without the phenyleph if only promethazine-cod syrup is avail  . DISCONTD: predniSONE (DELTASONE) 20 MG tablet    Sig: 3 tabs po qd x 3d, 2 tabs po qd x 3d, 1 tab po qd x 3d    Dispense:  18 tablet    Refill:  0  . amoxicillin-clavulanate (AUGMENTIN) 875-125 MG tablet    Sig: Take 1 tablet by mouth 2 (two)  times daily.    Dispense:  20 tablet    Refill:  0  . predniSONE (DELTASONE) 20 MG tablet    Sig: 3 tabs po qd x 3d, 2 tabs po qd x 3d, 1 tab po qd x 3d    Dispense:  18 tablet    Refill:  0    I personally performed the services described in this documentation, which was scribed in my presence. The recorded information has been reviewed and considered, and addended by me as needed.  Keith SorensonEva Shaw, MD MPH

## 2015-09-16 ENCOUNTER — Telehealth: Payer: Self-pay

## 2015-09-16 NOTE — Telephone Encounter (Signed)
Can we replace or possibly check database to make sure Rx was not filled. Please advise on note until Sunday.

## 2015-09-16 NOTE — Telephone Encounter (Signed)
Patient needs OOW note to return on Sunday.  Saw Dr. Clelia CroftShaw yesterday.   Lost the prescription for Phenyleph-Promethazine-Cod (PROMETHAZINE VC/CODEINE) 5-6.25-10 MG/5ML SYRP Can it be called in or does he need to come in to pick up .  [facility] [address] Call patient and let him know about both. PLEASE  [phone]

## 2015-09-16 NOTE — Telephone Encounter (Signed)
Rx sent. Pt notified. Note in pick up draw.

## 2015-09-16 NOTE — Telephone Encounter (Signed)
Yes, fine to replace - it is a very mild med so don't worry about database. This CAN be called in so that would be great. Yes, fine for the work note - whatever pt needs is fine.  If the pharmacy does not have this specific cough syrup - ok to substitute for the same promethazine-codeine without the phenylephrine - same dosing, sig, amount.

## 2015-12-03 ENCOUNTER — Other Ambulatory Visit: Payer: Self-pay | Admitting: Infectious Disease

## 2015-12-03 ENCOUNTER — Ambulatory Visit
Admission: RE | Admit: 2015-12-03 | Discharge: 2015-12-03 | Disposition: A | Discharge status: Home / Self Care | Payer: No Typology Code available for payment source | Source: Ambulatory Visit | Attending: Infectious Disease | Admitting: Infectious Disease

## 2015-12-03 DIAGNOSIS — R7611 Nonspecific reaction to tuberculin skin test without active tuberculosis: Secondary | ICD-10-CM

## 2016-01-04 ENCOUNTER — Other Ambulatory Visit: Payer: Self-pay | Admitting: Infectious Disease

## 2016-01-04 ENCOUNTER — Ambulatory Visit
Admission: RE | Admit: 2016-01-04 | Discharge: 2016-01-04 | Disposition: A | Discharge status: Home / Self Care | Payer: No Typology Code available for payment source | Source: Ambulatory Visit | Attending: Infectious Disease | Admitting: Infectious Disease

## 2016-01-04 DIAGNOSIS — R7611 Nonspecific reaction to tuberculin skin test without active tuberculosis: Secondary | ICD-10-CM

## 2018-02-22 ENCOUNTER — Encounter (INDEPENDENT_AMBULATORY_CARE_PROVIDER_SITE_OTHER): Payer: Self-pay | Admitting: Physician Assistant

## 2018-02-22 ENCOUNTER — Other Ambulatory Visit: Payer: Self-pay

## 2018-02-22 ENCOUNTER — Ambulatory Visit (INDEPENDENT_AMBULATORY_CARE_PROVIDER_SITE_OTHER): Payer: PRIVATE HEALTH INSURANCE | Admitting: Physician Assistant

## 2018-02-22 VITALS — BP 176/102 | HR 98 | Temp 98.1°F | Ht 67.0 in | Wt 127.0 lb

## 2018-02-22 DIAGNOSIS — J4520 Mild intermittent asthma, uncomplicated: Secondary | ICD-10-CM | POA: Diagnosis not present

## 2018-02-22 DIAGNOSIS — E119 Type 2 diabetes mellitus without complications: Secondary | ICD-10-CM | POA: Diagnosis not present

## 2018-02-22 DIAGNOSIS — Z131 Encounter for screening for diabetes mellitus: Secondary | ICD-10-CM | POA: Diagnosis not present

## 2018-02-22 DIAGNOSIS — I10 Essential (primary) hypertension: Secondary | ICD-10-CM | POA: Diagnosis not present

## 2018-02-22 LAB — POCT GLYCOSYLATED HEMOGLOBIN (HGB A1C): HEMOGLOBIN A1C: 8.3

## 2018-02-22 MED ORDER — ALBUTEROL SULFATE HFA 108 (90 BASE) MCG/ACT IN AERS: 2.0000 | INHALATION_SPRAY | RESPIRATORY_TRACT | 5 refills | Status: DC | PRN

## 2018-02-22 MED ORDER — LOSARTAN POTASSIUM 50 MG PO TABS: 50.0000 mg | ORAL_TABLET | Freq: Every day | ORAL | 3 refills | Status: DC

## 2018-02-22 MED ORDER — METFORMIN HCL 500 MG PO TABS: 500.0000 mg | ORAL_TABLET | Freq: Two times a day (BID) | ORAL | 3 refills | Status: DC

## 2018-02-22 NOTE — Patient Instructions (Signed)
Managing Your Hypertension Hypertension is commonly called high blood pressure. This is when the force of your blood pressing against the walls of your arteries is too strong. Arteries are blood vessels that carry blood from your heart throughout your body. Hypertension forces the heart to work harder to pump blood, and may cause the arteries to become narrow or stiff. Having untreated or uncontrolled hypertension can cause heart attack, stroke, kidney disease, and other problems. What are blood pressure readings? A blood pressure reading consists of a higher number over a lower number. Ideally, your blood pressure should be below 120/80. The first ("top") number is called the systolic pressure. It is a measure of the pressure in your arteries as your heart beats. The second ("bottom") number is called the diastolic pressure. It is a measure of the pressure in your arteries as the heart relaxes. What does my blood pressure reading mean? Blood pressure is classified into four stages. Based on your blood pressure reading, your health care provider may use the following stages to determine what type of treatment you need, if any. Systolic pressure and diastolic pressure are measured in a unit called mm Hg. Normal  Systolic pressure: below 120.  Diastolic pressure: below 80. Elevated  Systolic pressure: 120-129.  Diastolic pressure: below 80. Hypertension stage 1  Systolic pressure: 130-139.  Diastolic pressure: 80-89. Hypertension stage 2  Systolic pressure: 140 or above.  Diastolic pressure: 90 or above. What health risks are associated with hypertension? Managing your hypertension is an important responsibility. Uncontrolled hypertension can lead to:  A heart attack.  A stroke.  A weakened blood vessel (aneurysm).  Heart failure.  Kidney damage.  Eye damage.  Metabolic syndrome.  Memory and concentration problems.  What changes can I make to manage my  hypertension? Hypertension can be managed by making lifestyle changes and possibly by taking medicines. Your health care provider will help you make a plan to bring your blood pressure within a normal range. Eating and drinking  Eat a diet that is high in fiber and potassium, and low in salt (sodium), added sugar, and fat. An example eating plan is called the DASH (Dietary Approaches to Stop Hypertension) diet. To eat this way: ? Eat plenty of fresh fruits and vegetables. Try to fill half of your plate at each meal with fruits and vegetables. ? Eat whole grains, such as whole wheat pasta, brown rice, or whole grain bread. Fill about one quarter of your plate with whole grains. ? Eat low-fat diary products. ? Avoid fatty cuts of meat, processed or cured meats, and poultry with skin. Fill about one quarter of your plate with lean proteins such as fish, chicken without skin, beans, eggs, and tofu. ? Avoid premade and processed foods. These tend to be higher in sodium, added sugar, and fat.  Reduce your daily sodium intake. Most people with hypertension should eat less than 1,500 mg of sodium a day.  Limit alcohol intake to no more than 1 drink a day for nonpregnant women and 2 drinks a day for men. One drink equals 12 oz of beer, 5 oz of wine, or 1 oz of hard liquor. Lifestyle  Work with your health care provider to maintain a healthy body weight, or to lose weight. Ask what an ideal weight is for you.  Get at least 30 minutes of exercise that causes your heart to beat faster (aerobic exercise) most days of the week. Activities may include walking, swimming, or biking.  Include exercise   to strengthen your muscles (resistance exercise), such as weight lifting, as part of your weekly exercise routine. Try to do these types of exercises for 30 minutes at least 3 days a week.  Do not use any products that contain nicotine or tobacco, such as cigarettes and e-cigarettes. If you need help quitting, ask  your health care provider.  Control any long-term (chronic) conditions you have, such as high cholesterol or diabetes. Monitoring  Monitor your blood pressure at home as told by your health care provider. Your personal target blood pressure may vary depending on your medical conditions, your age, and other factors.  Have your blood pressure checked regularly, as often as told by your health care provider. Working with your health care provider  Review all the medicines you take with your health care provider because there may be side effects or interactions.  Talk with your health care provider about your diet, exercise habits, and other lifestyle factors that may be contributing to hypertension.  Visit your health care provider regularly. Your health care provider can help you create and adjust your plan for managing hypertension. Will I need medicine to control my blood pressure? Your health care provider may prescribe medicine if lifestyle changes are not enough to get your blood pressure under control, and if:  Your systolic blood pressure is 130 or higher.  Your diastolic blood pressure is 80 or higher.  Take medicines only as told by your health care provider. Follow the directions carefully. Blood pressure medicines must be taken as prescribed. The medicine does not work as well when you skip doses. Skipping doses also puts you at risk for problems. Contact a health care provider if:  You think you are having a reaction to medicines you have taken.  You have repeated (recurrent) headaches.  You feel dizzy.  You have swelling in your ankles.  You have trouble with your vision. Get help right away if:  You develop a severe headache or confusion.  You have unusual weakness or numbness, or you feel faint.  You have severe pain in your chest or abdomen.  You vomit repeatedly.  You have trouble breathing. Summary  Hypertension is when the force of blood pumping through  your arteries is too strong. If this condition is not controlled, it may put you at risk for serious complications.  Your personal target blood pressure may vary depending on your medical conditions, your age, and other factors. For most people, a normal blood pressure is less than 120/80.  Hypertension is managed by lifestyle changes, medicines, or both. Lifestyle changes include weight loss, eating a healthy, low-sodium diet, exercising more, and limiting alcohol. This information is not intended to replace advice given to you by your health care provider. Make sure you discuss any questions you have with your health care provider. Document Released: 08/07/2012 Document Revised: 10/11/2016 Document Reviewed: 10/11/2016 Elsevier Interactive Patient Education  2018 [employer] Inc[employer]    Diabetes Mellitus and Nutrition When you have diabetes (diabetes mellitus), it is very important to have healthy eating habits because your blood sugar (glucose) levels are greatly affected by what you eat and drink. Eating healthy foods in the appropriate amounts, at about the same times every day, can help you:  Control your blood glucose.  Lower your risk of heart disease.  Improve your blood pressure.  Reach or maintain a healthy weight.  Every person with diabetes is different, and each person has different needs for a meal plan. Your health care   provider may recommend that you work with a diet and nutrition specialist (dietitian) to make a meal plan that is best for you. Your meal plan may vary depending on factors such as:  The calories you need.  The medicines you take.  Your weight.  Your blood glucose, blood pressure, and cholesterol levels.  Your activity level.  Other health conditions you have, such as heart or kidney disease.  How do carbohydrates affect me? Carbohydrates affect your blood glucose level more than any other type of food. Eating carbohydrates naturally increases the amount  of glucose in your blood. Carbohydrate counting is a method for keeping track of how many carbohydrates you eat. Counting carbohydrates is important to keep your blood glucose at a healthy level, especially if you use insulin or take certain oral diabetes medicines. It is important to know how many carbohydrates you can safely have in each meal. This is different for every person. Your dietitian can help you calculate how many carbohydrates you should have at each meal and for snack. Foods that contain carbohydrates include:  Bread, cereal, rice, pasta, and crackers.  Potatoes and corn.  Peas, beans, and lentils.  Milk and yogurt.  Fruit and juice.  Desserts, such as cakes, cookies, ice cream, and candy.  How does alcohol affect me? Alcohol can cause a sudden decrease in blood glucose (hypoglycemia), especially if you use insulin or take certain oral diabetes medicines. Hypoglycemia can be a life-threatening condition. Symptoms of hypoglycemia (sleepiness, dizziness, and confusion) are similar to symptoms of having too much alcohol. If your health care provider says that alcohol is safe for you, follow these guidelines:  Limit alcohol intake to no more than 1 drink per day for nonpregnant women and 2 drinks per day for men. One drink equals 12 oz of beer, 5 oz of wine, or 1 oz of hard liquor.  Do not drink on an empty stomach.  Keep yourself hydrated with water, diet soda, or unsweetened iced tea.  Keep in mind that regular soda, juice, and other mixers may contain a lot of sugar and must be counted as carbohydrates.  What are tips for following this plan? Reading food labels  Start by checking the serving size on the label. The amount of calories, carbohydrates, fats, and other nutrients listed on the label are based on one serving of the food. Many foods contain more than one serving per package.  Check the total grams (g) of carbohydrates in one serving. You can calculate the  number of servings of carbohydrates in one serving by dividing the total carbohydrates by 15. For example, if a food has 30 g of total carbohydrates, it would be equal to 2 servings of carbohydrates.  Check the number of grams (g) of saturated and trans fats in one serving. Choose foods that have low or no amount of these fats.  Check the number of milligrams (mg) of sodium in one serving. Most people should limit total sodium intake to less than 2,300 mg per day.  Always check the nutrition information of foods labeled as "low-fat" or "nonfat". These foods may be higher in added sugar or refined carbohydrates and should be avoided.  Talk to your dietitian to identify your daily goals for nutrients listed on the label. Shopping  Avoid buying canned, premade, or processed foods. These foods tend to be high in fat, sodium, and added sugar.  Shop around the outside edge of the grocery store. This includes fresh fruits and vegetables, bulk   grains, fresh meats, and fresh dairy. Cooking  Use low-heat cooking methods, such as baking, instead of high-heat cooking methods like deep frying.  Cook using healthy oils, such as olive, canola, or sunflower oil.  Avoid cooking with butter, cream, or high-fat meats. Meal planning  Eat meals and snacks regularly, preferably at the same times every day. Avoid going long periods of time without eating.  Eat foods high in fiber, such as fresh fruits, vegetables, beans, and whole grains. Talk to your dietitian about how many servings of carbohydrates you can eat at each meal.  Eat 4-6 ounces of lean protein each day, such as lean meat, chicken, fish, eggs, or tofu. 1 ounce is equal to 1 ounce of meat, chicken, or fish, 1 egg, or 1/4 cup of tofu.  Eat some foods each day that contain healthy fats, such as avocado, nuts, seeds, and fish. Lifestyle   Check your blood glucose regularly.  Exercise at least 30 minutes 5 or more days each week, or as told by  your health care provider.  Take medicines as told by your health care provider.  Do not use any products that contain nicotine or tobacco, such as cigarettes and e-cigarettes. If you need help quitting, ask your health care provider.  Work with a counselor or diabetes educator to identify strategies to manage stress and any emotional and social challenges. What are some questions to ask my health care provider?  Do I need to meet with a diabetes educator?  Do I need to meet with a dietitian?  What number can I call if I have questions?  When are the best times to check my blood glucose? Where to find more information:  American Diabetes Association: diabetes.org/food-and-fitness/food  Academy of Nutrition and Dietetics: www.eatright.org/resources/health/diseases-and-conditions/diabetes  National Institute of Diabetes and Digestive and Kidney Diseases (NIH): www.niddk.nih.gov/health-information/diabetes/overview/diet-eating-physical-activity Summary  A healthy meal plan will help you control your blood glucose and maintain a healthy lifestyle.  Working with a diet and nutrition specialist (dietitian) can help you make a meal plan that is best for you.  Keep in mind that carbohydrates and alcohol have immediate effects on your blood glucose levels. It is important to count carbohydrates and to use alcohol carefully. This information is not intended to replace advice given to you by your health care provider. Make sure you discuss any questions you have with your health care provider. Document Released: 08/10/2005 Document Revised: 12/18/2016 Document Reviewed: 12/18/2016 Elsevier Interactive Patient Education  2018 Elsevier Inc.  

## 2018-02-22 NOTE — Progress Notes (Signed)
Needs new Rx for inhaler

## 2018-02-22 NOTE — Progress Notes (Signed)
Subjective:  Patient ID: Keith Bakernthony W Keitt, male    DOB: 06-15-1991  Age: 27 y.o. MRN: [id number]  CC: HTN, asthma  HPI Keith Maynard is a 27 y.o. male with a medical history of asthma presents as a new patient requesting albuterol refill. 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 Last use of inhaler three weeks ago but anticipates the increased need for albuterol due to spring changes. No current exacerbation. Feels well, breathing well. BP noted to be elevated today. Does not know about family history as "these types of things are not brought up". Does not endorse any other symptoms or complaints.        Outpatient Medications Prior to Visit  Medication Sig Dispense Refill  . albuterol (PROVENTIL,VENTOLIN) 90 MCG/ACT inhaler 2 puffs every 4 hours as needed for cough and wheezing and shortness of breath     . amoxicillin-clavulanate (AUGMENTIN) 875-125 MG tablet Take 1 tablet by mouth 2 (two) times daily. 20 tablet 0  . Phenyleph-Promethazine-Cod (PROMETHAZINE VC/CODEINE) 5-6.25-10 MG/5ML SYRP Take 10 mLs by mouth every 6 (six) hours as needed (headache, cough, congestion, sleep). 180 mL 0  . predniSONE (DELTASONE) 20 MG tablet 3 tabs po qd x 3d, 2 tabs po qd x 3d, 1 tab po qd x 3d 18 tablet 0   No facility-administered medications prior to visit.      ROS Review of Systems  Constitutional: Negative for chills, fever and malaise/fatigue.  Eyes: Negative for blurred vision.  Respiratory: Positive for shortness of breath and wheezing.   Cardiovascular: Negative for chest pain and palpitations.  Gastrointestinal: Negative for abdominal pain and nausea.  Genitourinary: Negative for dysuria and hematuria.  Musculoskeletal: Negative for joint pain and myalgias.  Skin: Negative for rash.  Neurological: Negative for tingling and headaches.  Psychiatric/Behavioral: Negative for depression. The patient is not nervous/anxious.     Objective:  BP (!) 189/107 (BP Location: Left Arm,  Patient Position: Sitting, Cuff Size: Normal)   Pulse 98   Temp 98.1 F (36.7 C) (Oral)   Ht 5\' 7"  (1.702 m)   Wt 127 lb (57.6 kg)   SpO2 100%   BMI 19.89 kg/m   BP/Weight 02/22/2018 09/15/2015 04/01/2015  Systolic BP 189 116 127  Diastolic BP 107 80 74  Wt. (Lbs) 127 119 -  BMI 19.89 18.1 -      Physical Exam  Constitutional: He is oriented to person, place, and time.  Well developed, thin, NAD, polite  HENT:  Head: Normocephalic and atraumatic.  Eyes: No scleral icterus.  Neck: Normal range of motion. Neck supple. No thyromegaly present.  Cardiovascular: Normal rate, regular rhythm and normal heart sounds.  No murmur heard. Pulmonary/Chest: Effort normal. No respiratory distress. He has wheezes (mild in the left lower lung field ). He has no rales.  Abdominal: Soft. Bowel sounds are normal. There is no tenderness.  Musculoskeletal: He exhibits no edema.  Neurological: He is alert and oriented to person, place, and time. No cranial nerve deficit. Coordination normal.  Skin: Skin is warm and dry. No rash noted. No erythema. No pallor.  Psychiatric: He has a normal mood and affect. His behavior is normal. Thought content normal.  Vitals reviewed.    Assessment & Plan:    1. Mild intermittent asthma, unspecified whether complicated - albuterol (PROVENTIL HFA;VENTOLIN HFA) 108 (90 Base) MCG/ACT inhaler; Inhale 2 puffs into the lungs every 4 (four) hours as needed for wheezing or shortness of breath.  Dispense: 1 Inhaler; Refill: 5  2. Hypertension, unspecified type - CBC with Differential - Comprehensive metabolic panel - TSH - losartan (COZAAR) 50 MG tablet; Take 1 tablet (50 mg total) by mouth daily.  Dispense: 90 tablet; Refill: 3  3. Screening for diabetes mellitus - HgB A1c 8.3%  4. Type 2 diabetes mellitus without complication, without long-term current use of insulin (HCC) - Begin metFORMIN (GLUCOPHAGE) 500 MG tablet; Take 1 tablet (500 mg total) by mouth 2  (two) times daily with a meal.  Dispense: 180 tablet; Refill: 3   Meds ordered this encounter  Medications  . albuterol (PROVENTIL HFA;VENTOLIN HFA) 108 (90 Base) MCG/ACT inhaler    Sig: Inhale 2 puffs into the lungs every 4 (four) hours as needed for wheezing or shortness of breath.    Dispense:  1 Inhaler    Refill:  5    Order Specific Question:   Supervising Provider    Answer:   Quentin Angst [id number]  . metFORMIN (GLUCOPHAGE) 500 MG tablet    Sig: Take 1 tablet (500 mg total) by mouth 2 (two) times daily with a meal.    Dispense:  180 tablet    Refill:  3    Order Specific Question:   Supervising Provider    Answer:   Quentin Angst [id number]  . losartan (COZAAR) 50 MG tablet    Sig: Take 1 tablet (50 mg total) by mouth daily.    Dispense:  90 tablet    Refill:  3    Order Specific Question:   Supervising Provider    Answer:   Quentin Angst [id number]    Follow-up: Return in about 1 month (around 03/22/2018) for HTN.   Loletta Specter PA

## 2018-02-23 LAB — CBC WITH DIFFERENTIAL/PLATELET
Basophils Absolute: 0 10*3/uL (ref 0.0–0.2)
Basos: 1 %
EOS (ABSOLUTE): 0.4 10*3/uL (ref 0.0–0.4)
Eos: 7 %
Hematocrit: 31.2 % — ABNORMAL LOW (ref 37.5–51.0)
Hemoglobin: 10.2 g/dL — ABNORMAL LOW (ref 13.0–17.7)
Immature Grans (Abs): 0 10*3/uL (ref 0.0–0.1)
Immature Granulocytes: 0 %
LYMPHS ABS: 2.5 10*3/uL (ref 0.7–3.1)
Lymphs: 46 %
MCH: 29.1 pg (ref 26.6–33.0)
MCHC: 32.7 g/dL (ref 31.5–35.7)
MCV: 89 fL (ref 79–97)
MONOS ABS: 0.4 10*3/uL (ref 0.1–0.9)
Monocytes: 7 %
NEUTROS PCT: 39 %
Neutrophils Absolute: 2.2 10*3/uL (ref 1.4–7.0)
Platelets: 308 10*3/uL (ref 150–379)
RBC: 3.51 x10E6/uL — ABNORMAL LOW (ref 4.14–5.80)
RDW: 12.8 % (ref 12.3–15.4)
WBC: 5.5 10*3/uL (ref 3.4–10.8)

## 2018-02-23 LAB — COMPREHENSIVE METABOLIC PANEL
ALT: 12 IU/L (ref 0–44)
AST: 9 IU/L (ref 0–40)
Albumin/Globulin Ratio: 1.3 (ref 1.2–2.2)
Albumin: 3.4 g/dL — ABNORMAL LOW (ref 3.5–5.5)
Alkaline Phosphatase: 80 IU/L (ref 39–117)
BILIRUBIN TOTAL: 0.4 mg/dL (ref 0.0–1.2)
BUN/Creatinine Ratio: 17 (ref 9–20)
BUN: 27 mg/dL — ABNORMAL HIGH (ref 6–20)
CHLORIDE: 107 mmol/L — AB (ref 96–106)
CO2: 22 mmol/L (ref 20–29)
Calcium: 8.8 mg/dL (ref 8.7–10.2)
Creatinine, Ser: 1.61 mg/dL — ABNORMAL HIGH (ref 0.76–1.27)
GFR calc Af Amer: 67 mL/min/{1.73_m2} (ref >59)
GFR calc non Af Amer: 58 mL/min/{1.73_m2} — ABNORMAL LOW (ref >59)
GLOBULIN, TOTAL: 2.7 g/dL (ref 1.5–4.5)
Glucose: 146 mg/dL — ABNORMAL HIGH (ref 65–99)
Potassium: 4.3 mmol/L (ref 3.5–5.2)
Sodium: 142 mmol/L (ref 134–144)
Total Protein: 6.1 g/dL (ref 6.0–8.5)

## 2018-02-23 LAB — TSH: TSH: 1.95 u[IU]/mL (ref 0.450–4.500)

## 2018-02-25 ENCOUNTER — Telehealth (INDEPENDENT_AMBULATORY_CARE_PROVIDER_SITE_OTHER): Payer: Self-pay

## 2018-02-25 NOTE — Telephone Encounter (Signed)
Patient aware of anemia, normal Thyroid, and elevated serum creatinine possibly indicating that he has CKD, will discuss with provider at next office visit. Maryjean Mornempestt S Roberts, CMA

## 2018-02-25 NOTE — Telephone Encounter (Signed)
-----   Message from Loletta Specteroger David Gomez, PA-C sent at 02/25/2018  1:46 PM EDT ----- Has anemia and elevated serum creatinine which may indicate she has CKD. We can discuss this in clinic. TSH is normal.

## 2018-03-22 ENCOUNTER — Ambulatory Visit (INDEPENDENT_AMBULATORY_CARE_PROVIDER_SITE_OTHER): Payer: PRIVATE HEALTH INSURANCE | Admitting: Physician Assistant

## 2018-03-22 ENCOUNTER — Encounter (INDEPENDENT_AMBULATORY_CARE_PROVIDER_SITE_OTHER): Payer: Self-pay | Admitting: Physician Assistant

## 2018-03-22 ENCOUNTER — Other Ambulatory Visit: Payer: Self-pay

## 2018-03-22 VITALS — BP 150/92 | HR 88 | Temp 97.8°F | Wt 125.0 lb

## 2018-03-22 DIAGNOSIS — E119 Type 2 diabetes mellitus without complications: Secondary | ICD-10-CM | POA: Diagnosis not present

## 2018-03-22 DIAGNOSIS — I10 Essential (primary) hypertension: Secondary | ICD-10-CM

## 2018-03-22 DIAGNOSIS — D649 Anemia, unspecified: Secondary | ICD-10-CM | POA: Diagnosis not present

## 2018-03-22 DIAGNOSIS — R7989 Other specified abnormal findings of blood chemistry: Secondary | ICD-10-CM

## 2018-03-22 DIAGNOSIS — Z23 Encounter for immunization: Secondary | ICD-10-CM

## 2018-03-22 MED ORDER — AMLODIPINE BESYLATE 10 MG PO TABS: 10.0000 mg | ORAL_TABLET | Freq: Every day | ORAL | 1 refills | Status: DC

## 2018-03-22 MED ORDER — GLIMEPIRIDE 4 MG PO TABS: 4.0000 mg | ORAL_TABLET | Freq: Every day | ORAL | 3 refills | Status: DC

## 2018-03-22 MED ORDER — PNEUMOCOCCAL VAC POLYVALENT 25 MCG/0.5ML IJ INJ: 0.5000 mL | INJECTION | INTRAMUSCULAR | Status: DC

## 2018-03-22 MED ORDER — PNEUMOCOCCAL VAC POLYVALENT 25 MCG/0.5ML IJ INJ
0.5000 mL | INJECTION | Freq: Once | INTRAMUSCULAR | Status: AC
  Administered 2018-03-22: 0.5 mL via INTRAMUSCULAR

## 2018-03-22 NOTE — Progress Notes (Signed)
Subjective:  Patient ID: Keith Maynard, male    DOB: 1991/09/24  Age: 27 y.o. MRN: [id number]  CC: HTN  HPI Keith Maynard is a 27 y.o. male with a medical history of asthma and HTN presents on f/u of HTN. Last BP 176/102 mmHg last month. Prescribed Losartan 50 mg po qday. Blood pressures today 171/103 mmHg and recheck 150/92 mmHg. Taking medications as directed. Feeling well. Denies any symptoms or complaints.      Outpatient Medications Prior to Visit  Medication Sig Dispense Refill  . albuterol (PROVENTIL HFA;VENTOLIN HFA) 108 (90 Base) MCG/ACT inhaler Inhale 2 puffs into the lungs every 4 (four) hours as needed for wheezing or shortness of breath. 1 Inhaler 5  . losartan (COZAAR) 50 MG tablet Take 1 tablet (50 mg total) by mouth daily. 90 tablet 3  . metFORMIN (GLUCOPHAGE) 500 MG tablet Take 1 tablet (500 mg total) by mouth 2 (two) times daily with a meal. 180 tablet 3  . albuterol (PROVENTIL,VENTOLIN) 90 MCG/ACT inhaler 2 puffs every 4 hours as needed for cough and wheezing and shortness of breath      No facility-administered medications prior to visit.      ROS Review of Systems  Constitutional: Negative for chills, fever and malaise/fatigue.  Eyes: Negative for blurred vision.  Respiratory: Negative for shortness of breath.   Cardiovascular: Negative for chest pain and palpitations.  Gastrointestinal: Negative for abdominal pain and nausea.  Genitourinary: Negative for dysuria and hematuria.  Musculoskeletal: Negative for joint pain and myalgias.  Skin: Negative for rash.  Neurological: Negative for tingling and headaches.  Psychiatric/Behavioral: Negative for depression. The patient is not nervous/anxious.     Objective:  BP (!) 150/92 (BP Location: Left Arm, Patient Position: Sitting, Cuff Size: Normal)   Pulse 88   Temp 97.8 F (36.6 C) (Oral)   Wt 125 lb (56.7 kg)   SpO2 100%   BMI 19.58 kg/m   BP/Weight 03/22/2018 02/22/2018 09/15/2015  Systolic BP  150 [id number] 116  Diastolic BP 92 102 80  Wt. (Lbs) 125 127 119  BMI 19.58 19.89 18.1      Physical Exam  Constitutional: He is oriented to person, place, and time.  Thin, NAD, reserved  HENT:  Head: Normocephalic and atraumatic.  Eyes: No scleral icterus.  Neck: Normal range of motion. Neck supple. No thyromegaly present.  Cardiovascular: Normal rate, regular rhythm and normal heart sounds.  Pulmonary/Chest: Effort normal.  Musculoskeletal: He exhibits no edema.  Neurological: He is alert and oriented to person, place, and time.  Skin: Skin is warm and dry. No rash noted. No erythema. No pallor.  Psychiatric: He has a normal mood and affect. His behavior is normal. Thought content normal.  Vitals reviewed.    Assessment & Plan:   1. Hypertension, unspecified type - VAS US RENAL ARTERY DUPLEX; Future - Begin amLODipine (NORVASC) 10 MG tablet; Take 1 tablet (10 mg total) by mouth daily.  Dispense: 90 tablet; Refill: 1 - Continue Losartan 50 mg   2. Anemia, unspecified type - Pathologist smear review  3. Elevated serum creatinine - Basic Metabolic Panel  4. Type 2 diabetes mellitus without complication, without long-term current use of insulin (HCC) - Begin glimepiride (AMARYL) 4 MG tablet; Take 1 tablet (4 mg total) by mouth daily before breakfast.  Dispense: 30 tablet; Refill: 3 - Stop Metformin due to elevated serum creatinine  5. Need for pneumococcal vaccination - pneumococcal 23 valent vaccine (PNU-IMMUNE) injection 0.5 mL  Meds ordered this encounter  Medications  . glimepiride (AMARYL) 4 MG tablet    Sig: Take 1 tablet (4 mg total) by mouth daily before breakfast.    Dispense:  30 tablet    Refill:  3    Order Specific Question:   Supervising Provider    Answer:   Quentin Angst [id number]  . amLODipine (NORVASC) 10 MG tablet    Sig: Take 1 tablet (10 mg total) by mouth daily.    Dispense:  90 tablet    Refill:  1    Order Specific Question:    Supervising Provider    Answer:   Quentin Angst [id number]  . pneumococcal 23 valent vaccine (PNU-IMMUNE) injection 0.5 mL    Follow-up: Return in about 1 month (around 04/19/2018) for HTN.   Loletta Specter PA

## 2018-03-22 NOTE — Patient Instructions (Signed)

## 2018-03-23 ENCOUNTER — Encounter (INDEPENDENT_AMBULATORY_CARE_PROVIDER_SITE_OTHER): Payer: Self-pay | Admitting: Physician Assistant

## 2018-03-23 LAB — PATHOLOGIST SMEAR REVIEW

## 2018-03-23 LAB — BASIC METABOLIC PANEL
BUN/Creatinine Ratio: 17 (ref 9–20)
BUN: 32 mg/dL — AB (ref 6–20)
CO2: 22 mmol/L (ref 20–29)
Calcium: 9 mg/dL (ref 8.7–10.2)
Chloride: 109 mmol/L — ABNORMAL HIGH (ref 96–106)
Creatinine, Ser: 1.89 mg/dL — ABNORMAL HIGH (ref 0.76–1.27)
GFR, EST AFRICAN AMERICAN: 55 mL/min/{1.73_m2} — AB (ref >59)
GFR, EST NON AFRICAN AMERICAN: 48 mL/min/{1.73_m2} — AB (ref >59)
Glucose: 146 mg/dL — ABNORMAL HIGH (ref 65–99)
Potassium: 4.9 mmol/L (ref 3.5–5.2)
Sodium: 141 mmol/L (ref 134–144)

## 2018-03-25 ENCOUNTER — Other Ambulatory Visit (INDEPENDENT_AMBULATORY_CARE_PROVIDER_SITE_OTHER): Payer: Self-pay | Admitting: Physician Assistant

## 2018-03-25 ENCOUNTER — Telehealth (INDEPENDENT_AMBULATORY_CARE_PROVIDER_SITE_OTHER): Payer: Self-pay

## 2018-03-25 DIAGNOSIS — J4520 Mild intermittent asthma, uncomplicated: Secondary | ICD-10-CM

## 2018-03-25 MED ORDER — ALBUTEROL SULFATE HFA 108 (90 BASE) MCG/ACT IN AERS: 2.0000 | INHALATION_SPRAY | RESPIRATORY_TRACT | 5 refills | Status: DC | PRN

## 2018-03-25 NOTE — Telephone Encounter (Signed)
Patient is aware that kidney filtration is more impaired than before, awaiting renal artery [place] before further advising. Maryjean Morn, CMA

## 2018-03-25 NOTE — Telephone Encounter (Signed)
-----   Message from Loletta Specter, PA-C sent at 03/25/2018 12:16 PM EDT ----- Kidney filtration is more impaired than previous. I will wait on the renal artery [place] study before further advise.

## 2018-03-26 ENCOUNTER — Telehealth (INDEPENDENT_AMBULATORY_CARE_PROVIDER_SITE_OTHER): Payer: Self-pay

## 2018-03-26 ENCOUNTER — Ambulatory Visit (HOSPITAL_COMMUNITY)
Admission: RE | Admit: 2018-03-26 | Discharge: 2018-03-26 | Disposition: A | Discharge status: Home / Self Care | Payer: PRIVATE HEALTH INSURANCE | Source: Ambulatory Visit | Attending: Physician Assistant | Admitting: Physician Assistant

## 2018-03-26 DIAGNOSIS — I10 Essential (primary) hypertension: Secondary | ICD-10-CM | POA: Diagnosis not present

## 2018-03-26 NOTE — Telephone Encounter (Signed)
-----   Message from Loletta Specter, PA-C sent at 03/26/2018  4:58 PM EDT ----- Renal arteries are normal. Impairment of kidney filtration will be further worked up at following visit but at this point, it seems attributed to his hypertension.

## 2018-03-26 NOTE — Progress Notes (Signed)
*  PRELIMINARY RESULTS* Vascular Ultrasound Renal Artery Duplex has been completed.   Findings are within normal limits without any evidence of hemodynamically significant renal artery stenosis bilaterally.  03/26/2018 9:34 AM Gertie Fey, BS, RVT, RDCS, RDMS

## 2018-03-26 NOTE — Telephone Encounter (Signed)
Left patient the following message and asked to call office back with any questions. Renal arteries are normal. Impairment of kidney filtration will be further worked up at following visit but at this point, it seems attributed to his hypertension. Maryjean Morn, CMA

## 2018-04-17 ENCOUNTER — Other Ambulatory Visit: Payer: Self-pay | Admitting: Nurse Practitioner

## 2018-04-17 DIAGNOSIS — I10 Essential (primary) hypertension: Secondary | ICD-10-CM

## 2018-04-17 MED ORDER — LOSARTAN POTASSIUM 50 MG PO TABS: 50.0000 mg | ORAL_TABLET | Freq: Every day | ORAL | 3 refills | Status: AC

## 2018-04-23 ENCOUNTER — Other Ambulatory Visit: Payer: Self-pay | Admitting: Nurse Practitioner

## 2018-04-29 ENCOUNTER — Other Ambulatory Visit (INDEPENDENT_AMBULATORY_CARE_PROVIDER_SITE_OTHER): Payer: Self-pay | Admitting: Physician Assistant

## 2018-05-04 ENCOUNTER — Other Ambulatory Visit (INDEPENDENT_AMBULATORY_CARE_PROVIDER_SITE_OTHER): Payer: Self-pay | Admitting: Physician Assistant

## 2018-05-04 DIAGNOSIS — J4520 Mild intermittent asthma, uncomplicated: Secondary | ICD-10-CM

## 2018-05-06 MED ORDER — ALBUTEROL SULFATE HFA 108 (90 BASE) MCG/ACT IN AERS: 2.0000 | INHALATION_SPRAY | RESPIRATORY_TRACT | 5 refills | Status: DC | PRN

## 2018-05-06 NOTE — Telephone Encounter (Signed)
Patient was last seen in April and received a refill on 03/25/18. The refill ended and needed to be renewed.

## 2018-05-11 ENCOUNTER — Other Ambulatory Visit (INDEPENDENT_AMBULATORY_CARE_PROVIDER_SITE_OTHER): Payer: Self-pay | Admitting: Physician Assistant

## 2018-05-11 DIAGNOSIS — E119 Type 2 diabetes mellitus without complications: Secondary | ICD-10-CM

## 2018-05-13 NOTE — Telephone Encounter (Signed)
FWD to PCP. Tempestt S Roberts, CMA  

## 2018-05-21 ENCOUNTER — Other Ambulatory Visit (INDEPENDENT_AMBULATORY_CARE_PROVIDER_SITE_OTHER): Payer: Self-pay | Admitting: Physician Assistant

## 2018-06-13 ENCOUNTER — Other Ambulatory Visit (INDEPENDENT_AMBULATORY_CARE_PROVIDER_SITE_OTHER): Payer: Self-pay | Admitting: Physician Assistant

## 2018-06-13 DIAGNOSIS — E119 Type 2 diabetes mellitus without complications: Secondary | ICD-10-CM

## 2018-06-13 NOTE — Telephone Encounter (Signed)
FWD to PCP. Tempestt S Roberts, CMA  

## 2018-06-14 ENCOUNTER — Encounter (HOSPITAL_COMMUNITY): Payer: Self-pay | Admitting: Emergency Medicine

## 2018-06-14 ENCOUNTER — Inpatient Hospital Stay (HOSPITAL_COMMUNITY)
Admission: EM | Admit: 2018-06-14 | Discharge: 2018-06-18 | DRG: 683 | Disposition: A | Discharge status: Home / Self Care | Payer: PRIVATE HEALTH INSURANCE | Attending: Internal Medicine | Admitting: Internal Medicine

## 2018-06-14 DIAGNOSIS — N179 Acute kidney failure, unspecified: Secondary | ICD-10-CM | POA: Diagnosis not present

## 2018-06-14 DIAGNOSIS — R600 Localized edema: Secondary | ICD-10-CM | POA: Diagnosis present

## 2018-06-14 DIAGNOSIS — R809 Proteinuria, unspecified: Secondary | ICD-10-CM

## 2018-06-14 DIAGNOSIS — I129 Hypertensive chronic kidney disease with stage 1 through stage 4 chronic kidney disease, or unspecified chronic kidney disease: Secondary | ICD-10-CM | POA: Diagnosis present

## 2018-06-14 DIAGNOSIS — J45909 Unspecified asthma, uncomplicated: Secondary | ICD-10-CM | POA: Diagnosis present

## 2018-06-14 DIAGNOSIS — E872 Acidosis, unspecified: Secondary | ICD-10-CM | POA: Diagnosis present

## 2018-06-14 DIAGNOSIS — I10 Essential (primary) hypertension: Secondary | ICD-10-CM | POA: Diagnosis present

## 2018-06-14 DIAGNOSIS — E1165 Type 2 diabetes mellitus with hyperglycemia: Secondary | ICD-10-CM | POA: Diagnosis present

## 2018-06-14 DIAGNOSIS — R3129 Other microscopic hematuria: Secondary | ICD-10-CM

## 2018-06-14 DIAGNOSIS — E870 Hyperosmolality and hypernatremia: Secondary | ICD-10-CM | POA: Diagnosis present

## 2018-06-14 DIAGNOSIS — Z7984 Long term (current) use of oral hypoglycemic drugs: Secondary | ICD-10-CM

## 2018-06-14 DIAGNOSIS — E1122 Type 2 diabetes mellitus with diabetic chronic kidney disease: Secondary | ICD-10-CM | POA: Diagnosis present

## 2018-06-14 DIAGNOSIS — Z833 Family history of diabetes mellitus: Secondary | ICD-10-CM

## 2018-06-14 DIAGNOSIS — N183 Chronic kidney disease, stage 3 (moderate): Secondary | ICD-10-CM | POA: Diagnosis present

## 2018-06-14 DIAGNOSIS — Z91018 Allergy to other foods: Secondary | ICD-10-CM

## 2018-06-14 DIAGNOSIS — E1142 Type 2 diabetes mellitus with diabetic polyneuropathy: Secondary | ICD-10-CM | POA: Diagnosis present

## 2018-06-14 DIAGNOSIS — D649 Anemia, unspecified: Secondary | ICD-10-CM | POA: Diagnosis present

## 2018-06-14 DIAGNOSIS — E119 Type 2 diabetes mellitus without complications: Secondary | ICD-10-CM

## 2018-06-14 DIAGNOSIS — E875 Hyperkalemia: Secondary | ICD-10-CM | POA: Diagnosis present

## 2018-06-14 DIAGNOSIS — F17210 Nicotine dependence, cigarettes, uncomplicated: Secondary | ICD-10-CM | POA: Diagnosis present

## 2018-06-14 DIAGNOSIS — Z79899 Other long term (current) drug therapy: Secondary | ICD-10-CM

## 2018-06-14 DIAGNOSIS — E8809 Other disorders of plasma-protein metabolism, not elsewhere classified: Secondary | ICD-10-CM | POA: Diagnosis present

## 2018-06-14 DIAGNOSIS — D631 Anemia in chronic kidney disease: Secondary | ICD-10-CM | POA: Diagnosis present

## 2018-06-14 DIAGNOSIS — J4520 Mild intermittent asthma, uncomplicated: Secondary | ICD-10-CM | POA: Diagnosis present

## 2018-06-14 HISTORY — DX: Essential (primary) hypertension: I10

## 2018-06-14 HISTORY — DX: Type 2 diabetes mellitus without complications: E11.9

## 2018-06-14 NOTE — ED Triage Notes (Addendum)
Pt reports bilateral feet swelling for approx a month and a half, reports today he had some stinging. Hx DM and HTN.  Ambulatory with some difficulty.

## 2018-06-15 ENCOUNTER — Inpatient Hospital Stay (HOSPITAL_COMMUNITY): Payer: PRIVATE HEALTH INSURANCE

## 2018-06-15 ENCOUNTER — Emergency Department (HOSPITAL_COMMUNITY): Payer: PRIVATE HEALTH INSURANCE

## 2018-06-15 ENCOUNTER — Other Ambulatory Visit: Payer: Self-pay

## 2018-06-15 ENCOUNTER — Encounter (HOSPITAL_COMMUNITY): Payer: Self-pay | Admitting: *Deleted

## 2018-06-15 DIAGNOSIS — M7989 Other specified soft tissue disorders: Secondary | ICD-10-CM

## 2018-06-15 DIAGNOSIS — I10 Essential (primary) hypertension: Secondary | ICD-10-CM | POA: Diagnosis not present

## 2018-06-15 DIAGNOSIS — N179 Acute kidney failure, unspecified: Secondary | ICD-10-CM | POA: Diagnosis present

## 2018-06-15 DIAGNOSIS — E875 Hyperkalemia: Secondary | ICD-10-CM

## 2018-06-15 DIAGNOSIS — N183 Chronic kidney disease, stage 3 (moderate): Secondary | ICD-10-CM | POA: Diagnosis present

## 2018-06-15 DIAGNOSIS — J4520 Mild intermittent asthma, uncomplicated: Secondary | ICD-10-CM

## 2018-06-15 DIAGNOSIS — E1122 Type 2 diabetes mellitus with diabetic chronic kidney disease: Secondary | ICD-10-CM | POA: Diagnosis present

## 2018-06-15 DIAGNOSIS — E872 Acidosis, unspecified: Secondary | ICD-10-CM | POA: Diagnosis present

## 2018-06-15 DIAGNOSIS — E1165 Type 2 diabetes mellitus with hyperglycemia: Secondary | ICD-10-CM | POA: Diagnosis present

## 2018-06-15 DIAGNOSIS — Z794 Long term (current) use of insulin: Secondary | ICD-10-CM | POA: Diagnosis not present

## 2018-06-15 DIAGNOSIS — Z833 Family history of diabetes mellitus: Secondary | ICD-10-CM | POA: Diagnosis not present

## 2018-06-15 DIAGNOSIS — Z79899 Other long term (current) drug therapy: Secondary | ICD-10-CM | POA: Diagnosis not present

## 2018-06-15 DIAGNOSIS — I129 Hypertensive chronic kidney disease with stage 1 through stage 4 chronic kidney disease, or unspecified chronic kidney disease: Secondary | ICD-10-CM | POA: Diagnosis present

## 2018-06-15 DIAGNOSIS — F17210 Nicotine dependence, cigarettes, uncomplicated: Secondary | ICD-10-CM | POA: Diagnosis present

## 2018-06-15 DIAGNOSIS — E8809 Other disorders of plasma-protein metabolism, not elsewhere classified: Secondary | ICD-10-CM | POA: Diagnosis present

## 2018-06-15 DIAGNOSIS — E1142 Type 2 diabetes mellitus with diabetic polyneuropathy: Secondary | ICD-10-CM | POA: Diagnosis present

## 2018-06-15 DIAGNOSIS — E870 Hyperosmolality and hypernatremia: Secondary | ICD-10-CM

## 2018-06-15 DIAGNOSIS — D631 Anemia in chronic kidney disease: Secondary | ICD-10-CM | POA: Diagnosis present

## 2018-06-15 DIAGNOSIS — Z91018 Allergy to other foods: Secondary | ICD-10-CM | POA: Diagnosis not present

## 2018-06-15 DIAGNOSIS — D649 Anemia, unspecified: Secondary | ICD-10-CM

## 2018-06-15 DIAGNOSIS — R600 Localized edema: Secondary | ICD-10-CM

## 2018-06-15 LAB — URINALYSIS, ROUTINE W REFLEX MICROSCOPIC
Bacteria, UA: NONE SEEN
Bilirubin Urine: NEGATIVE
Glucose, UA: 50 mg/dL — AB
KETONES UR: 5 mg/dL — AB
Leukocytes, UA: NEGATIVE
Nitrite: NEGATIVE
Protein, ur: >=300 mg/dL — AB
Specific Gravity, Urine: 1.012 (ref 1.005–1.030)
pH: 5 (ref 5.0–8.0)

## 2018-06-15 LAB — CBC WITH DIFFERENTIAL/PLATELET
Abs Immature Granulocytes: 0 10*3/uL (ref 0.0–0.1)
Basophils Absolute: 0.1 10*3/uL (ref 0.0–0.1)
Basophils Relative: 1 %
EOS PCT: 7 %
Eosinophils Absolute: 0.4 10*3/uL (ref 0.0–0.7)
HEMATOCRIT: 28 % — AB (ref 39.0–52.0)
HEMOGLOBIN: 8.6 g/dL — AB (ref 13.0–17.0)
IMMATURE GRANULOCYTES: 0 %
LYMPHS ABS: 1.5 10*3/uL (ref 0.7–4.0)
LYMPHS PCT: 27 %
MCH: 28.6 pg (ref 26.0–34.0)
MCHC: 30.7 g/dL (ref 30.0–36.0)
MCV: 93 fL (ref 78.0–100.0)
MONOS PCT: 6 %
Monocytes Absolute: 0.4 10*3/uL (ref 0.1–1.0)
Neutro Abs: 3.4 10*3/uL (ref 1.7–7.7)
Neutrophils Relative %: 59 %
Platelets: 266 10*3/uL (ref 150–400)
RBC: 3.01 MIL/uL — ABNORMAL LOW (ref 4.22–5.81)
RDW: 13.9 % (ref 11.5–15.5)
WBC: 5.8 10*3/uL (ref 4.0–10.5)

## 2018-06-15 LAB — ABO/RH: ABO/RH(D): A NEG

## 2018-06-15 LAB — COMPREHENSIVE METABOLIC PANEL
ALBUMIN: 2.9 g/dL — AB (ref 3.5–5.0)
ALK PHOS: 71 U/L (ref 38–126)
ALT: 18 U/L (ref 0–44)
AST: 15 U/L (ref 15–41)
Anion gap: 30 — ABNORMAL HIGH (ref 5–15)
BILIRUBIN TOTAL: 0.6 mg/dL (ref 0.3–1.2)
BUN: 38 mg/dL — AB (ref 6–20)
CALCIUM: 6.8 mg/dL — AB (ref 8.9–10.3)
CO2: 15 mmol/L — ABNORMAL LOW (ref 22–32)
CREATININE: 2.69 mg/dL — AB (ref 0.61–1.24)
Chloride: 104 mmol/L (ref 98–111)
GFR calc Af Amer: 36 mL/min — ABNORMAL LOW (ref >60)
GFR calc non Af Amer: 31 mL/min — ABNORMAL LOW (ref >60)
GLUCOSE: 166 mg/dL — AB (ref 70–99)
Potassium: 5.2 mmol/L — ABNORMAL HIGH (ref 3.5–5.1)
Sodium: 149 mmol/L — ABNORMAL HIGH (ref 135–145)
TOTAL PROTEIN: 5.9 g/dL — AB (ref 6.5–8.1)

## 2018-06-15 LAB — PREALBUMIN: PREALBUMIN: 31.7 mg/dL (ref 18–38)

## 2018-06-15 LAB — BASIC METABOLIC PANEL
ANION GAP: 8 (ref 5–15)
BUN: 40 mg/dL — ABNORMAL HIGH (ref 6–20)
CO2: 21 mmol/L — ABNORMAL LOW (ref 22–32)
Calcium: 8.5 mg/dL — ABNORMAL LOW (ref 8.9–10.3)
Chloride: 113 mmol/L — ABNORMAL HIGH (ref 98–111)
Creatinine, Ser: 2.72 mg/dL — ABNORMAL HIGH (ref 0.61–1.24)
GFR, EST AFRICAN AMERICAN: 35 mL/min — AB (ref >60)
GFR, EST NON AFRICAN AMERICAN: 31 mL/min — AB (ref >60)
Glucose, Bld: 183 mg/dL — ABNORMAL HIGH (ref 70–99)
POTASSIUM: 5.2 mmol/L — AB (ref 3.5–5.1)
SODIUM: 142 mmol/L (ref 135–145)

## 2018-06-15 LAB — BRAIN NATRIURETIC PEPTIDE: B Natriuretic Peptide: 90 pg/mL (ref 0.0–100.0)

## 2018-06-15 LAB — HEMOGLOBIN A1C
HEMOGLOBIN A1C: 5.4 % (ref 4.8–5.6)
MEAN PLASMA GLUCOSE: 108.28 mg/dL

## 2018-06-15 LAB — TYPE AND SCREEN
ABO/RH(D): A NEG
Antibody Screen: NEGATIVE

## 2018-06-15 LAB — TSH: TSH: 1.738 u[IU]/mL (ref 0.350–4.500)

## 2018-06-15 LAB — CREATININE, URINE, RANDOM: Creatinine, Urine: 90.61 mg/dL

## 2018-06-15 LAB — GLUCOSE, CAPILLARY
GLUCOSE-CAPILLARY: 99 mg/dL (ref 70–99)
Glucose-Capillary: 129 mg/dL — ABNORMAL HIGH (ref 70–99)
Glucose-Capillary: 99 mg/dL (ref 70–99)

## 2018-06-15 LAB — D-DIMER, QUANTITATIVE: D-Dimer, Quant: 0.77 ug/mL-FEU — ABNORMAL HIGH (ref 0.00–0.50)

## 2018-06-15 LAB — HIV ANTIBODY (ROUTINE TESTING W REFLEX): HIV Screen 4th Generation wRfx: NONREACTIVE

## 2018-06-15 LAB — SODIUM, URINE, RANDOM: SODIUM UR: 89 mmol/L

## 2018-06-15 LAB — PROTEIN, URINE, RANDOM: TOTAL PROTEIN, URINE: 452 mg/dL

## 2018-06-15 MED ORDER — AMLODIPINE BESYLATE 10 MG PO TABS
10.0000 mg | ORAL_TABLET | Freq: Every day | ORAL | Status: DC
  Administered 2018-06-15 – 2018-06-18 (×4): 10 mg via ORAL
  Filled 2018-06-15 (×5): qty 1

## 2018-06-15 MED ORDER — SODIUM CHLORIDE 0.9% FLUSH
3.0000 mL | Freq: Two times a day (BID) | INTRAVENOUS | Status: DC
  Administered 2018-06-16 – 2018-06-18 (×5): 3 mL via INTRAVENOUS

## 2018-06-15 MED ORDER — ONDANSETRON HCL 4 MG/2ML IJ SOLN: 4.0000 mg | Freq: Four times a day (QID) | INTRAMUSCULAR | Status: DC | PRN

## 2018-06-15 MED ORDER — SODIUM CHLORIDE 0.9 % IV SOLN
INTRAVENOUS | Status: DC
  Administered 2018-06-15 – 2018-06-16 (×4): via INTRAVENOUS

## 2018-06-15 MED ORDER — FUROSEMIDE 40 MG PO TABS
40.0000 mg | ORAL_TABLET | Freq: Every day | ORAL | Status: DC
  Administered 2018-06-15 – 2018-06-18 (×4): 40 mg via ORAL
  Filled 2018-06-15 (×4): qty 1

## 2018-06-15 MED ORDER — ONDANSETRON HCL 4 MG PO TABS: 4.0000 mg | ORAL_TABLET | Freq: Four times a day (QID) | ORAL | Status: DC | PRN

## 2018-06-15 MED ORDER — INSULIN ASPART 100 UNIT/ML ~~LOC~~ SOLN
0.0000 [IU] | Freq: Three times a day (TID) | SUBCUTANEOUS | Status: DC
  Administered 2018-06-16: 2 [IU] via SUBCUTANEOUS
  Administered 2018-06-17: 1 [IU] via SUBCUTANEOUS
  Administered 2018-06-17: 2 [IU] via SUBCUTANEOUS

## 2018-06-15 MED ORDER — ACETAMINOPHEN 325 MG PO TABS
650.0000 mg | ORAL_TABLET | Freq: Four times a day (QID) | ORAL | Status: DC | PRN
  Administered 2018-06-18: 650 mg via ORAL
  Filled 2018-06-15: qty 2

## 2018-06-15 MED ORDER — ACETAMINOPHEN 650 MG RE SUPP: 650.0000 mg | Freq: Four times a day (QID) | RECTAL | Status: DC | PRN

## 2018-06-15 MED ORDER — LATANOPROST 0.005 % OP SOLN
1.0000 [drp] | Freq: Every day | OPHTHALMIC | Status: DC
  Administered 2018-06-15 – 2018-06-17 (×3): 1 [drp] via OPHTHALMIC
  Filled 2018-06-15: qty 2.5

## 2018-06-15 MED ORDER — LOSARTAN POTASSIUM 50 MG PO TABS
50.0000 mg | ORAL_TABLET | Freq: Every day | ORAL | Status: DC
  Administered 2018-06-15 – 2018-06-18 (×4): 50 mg via ORAL
  Filled 2018-06-15 (×4): qty 1

## 2018-06-15 MED ORDER — HYDRALAZINE HCL 20 MG/ML IJ SOLN: 10.0000 mg | INTRAMUSCULAR | Status: DC | PRN

## 2018-06-15 MED ORDER — SODIUM CHLORIDE 0.9 % IV BOLUS
1000.0000 mL | Freq: Once | INTRAVENOUS | Status: AC
  Administered 2018-06-15: 1000 mL via INTRAVENOUS

## 2018-06-15 MED ORDER — SODIUM CHLORIDE 0.9 % IV SOLN
1.0000 g | Freq: Once | INTRAVENOUS | Status: AC
  Administered 2018-06-15: 1 g via INTRAVENOUS
  Filled 2018-06-15: qty 10

## 2018-06-15 MED ORDER — PREDNISOLONE ACETATE 1 % OP SUSP
1.0000 [drp] | Freq: Three times a day (TID) | OPHTHALMIC | Status: DC
  Administered 2018-06-15 – 2018-06-18 (×12): 1 [drp] via OPHTHALMIC
  Filled 2018-06-15: qty 5

## 2018-06-15 MED ORDER — ALBUTEROL SULFATE (2.5 MG/3ML) 0.083% IN NEBU: 2.5000 mg | INHALATION_SOLUTION | RESPIRATORY_TRACT | Status: DC | PRN

## 2018-06-15 MED ORDER — SODIUM POLYSTYRENE SULFONATE 15 GM/60ML PO SUSP
15.0000 g | Freq: Once | ORAL | Status: AC
  Administered 2018-06-15: 15 g via ORAL
  Filled 2018-06-15: qty 60

## 2018-06-15 NOTE — Progress Notes (Signed)
Keith Maynard is a 27 y.o. male with medical history significant of HTN, DM type II, and mild intermittent asthma; who presented with complaints of lower extremity swelling over the last 1-1/2 months, associated with polyuria, increased frequency of urination. Labs were abnormal with elevated creatinine and BUN, elevated potassium and sodium. He was admitted for evaluation of AKI with metabolic acidosis.  UA significant for proteinuria, large hemoglobin, and glucose, without any signs of infection, no casts. His FeNa IS 1.9, suspicion of intrinsic renal failure . His albumin is 2.9, total protein is 5.9.    Assessment:  1. AKI with metabolic acidosis.  2. Diabetes Mellitus 3. Hypertension 4. Asthma.  5. hyperkalemia    Plan:  1. Rule out glomerulonephritis/ nephrotic syndrome with a renal biopsy.  2. Nephrology consult from Dr Lowell GuitarPowell requested.  3. Check HIV antibody 4. Check 24 hour urine protein.  5. Monitor renal parameters.  6. Check lower extremity venous duplex to rule out DVT.  7. Correct hyperkalemia.    Keith ModyVijaya [name] Wronski, MD [phone]

## 2018-06-15 NOTE — ED Provider Notes (Signed)
[facility] EMERGENCY [facility] Provider Note   CSN: [id number] Arrival date & time: 06/14/18  2129     History   Chief Complaint Chief Complaint  Patient presents with  . Leg Swelling    HPI Keith Maynard is a 27 y.o. male.  Patient presents to the ER with multiple complaints.  He has noticed that for the last 1-1/2 months he has been having swelling of his feet and lower legs.  This has been associated with a burning pain and causing him trouble walking because of the discomfort.  He has diabetes and hypertension.  He reports that his blood sugars have been well controlled, usually around 100.  He has not, however, been able to get good control of his blood pressure.  He reports that his blood pressure is normally over 150 systolic despite taking his medications.  He has not had headache, blurred vision, shortness of breath, chest pain.     Past Medical History:  Diagnosis Date  . Asthma   . Diabetes mellitus without complication (HCC)   . Hypertension     Patient Active Problem List   Diagnosis Date Noted  . UNSPECIFIED ADJUSTMENT REACTION 02/17/2009  . ASTHMA, UNSPECIFIED 01/24/2007    History reviewed. No pertinent surgical history.      Home Medications    Prior to Admission medications   Medication Sig Start Date End Date Taking? Authorizing Provider  albuterol (PROVENTIL HFA;VENTOLIN HFA) 108 (90 Base) MCG/ACT inhaler Inhale 2 puffs into the lungs every 4 (four) hours as needed for wheezing or shortness of breath. 05/06/18 06/15/18 Yes Loletta SpecterGomez, Roger David, PA-C  amLODipine (NORVASC) 10 MG tablet Take 1 tablet (10 mg total) by mouth daily. 03/22/18  Yes Loletta SpecterGomez, Roger David, PA-C  glimepiride (AMARYL) 4 MG tablet TAKE 1 TABLET (4 MG TOTAL) BY MOUTH DAILY BEFORE BREAKFAST. 06/13/18  Yes Loletta SpecterGomez, Roger David, PA-C  latanoprost (XALATAN) 0.005 % ophthalmic solution Place 1 drop into the left eye at bedtime.   Yes [provider]  losartan  (COZAAR) 50 MG tablet Take 1 tablet (50 mg total) by mouth daily. 04/17/18  Yes Claiborne RiggFleming, Zelda W, NP  prednisoLONE acetate (PRED FORTE) 1 % ophthalmic suspension Place 1 drop into the left eye 4 (four) times daily.   Yes [provider]  glimepiride (AMARYL) 4 MG tablet TAKE 1 TABLET (4 MG TOTAL) BY MOUTH DAILY BEFORE BREAKFAST. Patient not taking: Reported on 06/15/2018 05/13/18   Loletta SpecterGomez, Roger David, PA-C    Family History Family History  Problem Relation Age of Onset  . Diabetes Mother   . Stroke Father     Social History Social History   Tobacco Use  . Smoking status: Light Tobacco Smoker    Types: Cigarettes  . Smokeless tobacco: Never Used  Substance Use Topics  . Alcohol use: Yes    Alcohol/week: 0.0 oz  . Drug use: No     Allergies   Other   Review of Systems Review of Systems  Respiratory: Negative for shortness of breath.   Cardiovascular: Positive for leg swelling. Negative for chest pain.  Neurological: Negative for headaches.  All other systems reviewed and are negative.    Physical Exam Updated Vital Signs BP (!) 159/91 (BP Location: Left Arm)   Pulse (!) 106   Temp 98.5 F (36.9 C) (Oral)   Resp 16   SpO2 100%   Physical Exam  Constitutional: He is oriented to person, place, and time. He appears well-developed and  well-nourished. No distress.  HENT:  Head: Normocephalic and atraumatic.  Right Ear: Hearing normal.  Left Ear: Hearing normal.  Nose: Nose normal.  Mouth/Throat: Oropharynx is clear and moist and mucous membranes are normal.  Eyes: Pupils are equal, round, and reactive to light. Conjunctivae and EOM are normal.  Neck: Normal range of motion. Neck supple.  Cardiovascular: Regular rhythm, S1 normal and S2 normal. Exam reveals no gallop and no friction rub.  No murmur heard. Pulmonary/Chest: Effort normal and breath sounds normal. No respiratory distress. He exhibits no tenderness.  Abdominal: Soft. Normal appearance and bowel  sounds are normal. There is no hepatosplenomegaly. There is no tenderness. There is no rebound, no guarding, no tenderness at McBurney's point and negative Murphy's sign. No hernia.  Musculoskeletal: Normal range of motion.  Bilateral nonpitting pedal edema  Neurological: He is alert and oriented to person, place, and time. He has normal strength. No cranial nerve deficit or sensory deficit. Coordination normal. GCS eye subscore is 4. GCS verbal subscore is 5. GCS motor subscore is 6.  Skin: Skin is warm, dry and intact. No rash noted. No cyanosis.  Psychiatric: He has a normal mood and affect. His speech is normal and behavior is normal. Thought content normal.  Nursing note and vitals reviewed.    ED Treatments / Results  Labs (all labs ordered are listed, but only abnormal results are displayed) Labs Reviewed  COMPREHENSIVE METABOLIC PANEL - Abnormal; Notable for the following components:      Result Value   Sodium 149 (*)    Potassium 5.2 (*)    CO2 15 (*)    Glucose, Bld 166 (*)    BUN 38 (*)    Creatinine, Ser 2.69 (*)    Calcium 6.8 (*)    Total Protein 5.9 (*)    Albumin 2.9 (*)    GFR calc non Af Amer 31 (*)    GFR calc Af Amer 36 (*)    Anion gap 30 (*)    All other components within normal limits  URINALYSIS, ROUTINE W REFLEX MICROSCOPIC - Abnormal; Notable for the following components:   Color, Urine STRAW (*)    Glucose, UA 50 (*)    Hgb urine dipstick LARGE (*)    Ketones, ur 5 (*)    Protein, ur >=300 (*)    All other components within normal limits  CBC WITH DIFFERENTIAL/PLATELET - Abnormal; Notable for the following components:   RBC 3.01 (*)    Hemoglobin 8.6 (*)    HCT 28.0 (*)    All other components within normal limits  D-DIMER, QUANTITATIVE (NOT AT [facility]) - Abnormal; Notable for the following components:   D-Dimer, Quant 0.77 (*)    All other components within normal limits  BRAIN NATRIURETIC PEPTIDE    EKG None  Radiology Dg Chest 2  View  Result Date: 06/15/2018 CLINICAL DATA:  Initial evaluation for bilateral leg edema for 1 month, shortness of breath. EXAM: CHEST - 2 VIEW COMPARISON:  Prior radiograph from 01/04/16 FINDINGS: The cardiac and mediastinal silhouettes are stable in size and contour, and remain within normal limits. The lungs are normally inflated. No airspace consolidation, pleural effusion, or pulmonary edema is identified. There is no pneumothorax. No acute osseous abnormality identified. IMPRESSION: No active cardiopulmonary disease. Electronically Signed   By: Rise Mu M.D.   On: 06/15/2018 01:52    Procedures Procedures (including critical care time)  Medications Ordered in ED Medications - No data to display  Initial Impression / Assessment and Plan / ED Course  I have reviewed the triage vital signs and the nursing notes.  Pertinent labs & imaging results that were available during my care of the patient were reviewed by me and considered in my medical decision making (see chart for details).     Patient presents to the ER for evaluation of swelling of his legs.  He has noticed this occurring over the last 1 to 1-1/2 months.  Patient has a history of diabetes and hypertension.  It sounds as though his diabetes has been fairly well controlled, but he has had difficult to manage blood pressure.  He has bilateral pedal edema that is nonpitting.  No shortness of breath.  No chest pain.  Lungs are clear.  Chest x-ray was unremarkable.  BNP is normal.  No concern for congestive heart failure.  D-dimer slightly elevated, low suspicion for DVT at this time.  Lab work reveals acute kidney injury with hypoalbuminemia.  Urinalysis shows significant proteinuria but also some microscopic hematuria.  He is anemic.  Current renal picture is mixed.  He has heavy proteinuria and edema suggest nephrotic syndrome, however the anemia and hematuria could suggest severe nephritic syndrome as well.  Will  hospitalize for further work-up.  Final Clinical Impressions(s) / ED Diagnoses   Final diagnoses:  AKI (acute kidney injury) [facility])    ED Discharge Orders    None       Pollina, Canary Brim, MD 06/15/18 [phone]

## 2018-06-15 NOTE — Progress Notes (Signed)
Called ER RN for report. Room ready.  

## 2018-06-15 NOTE — Plan of Care (Signed)
  Problem: Education: Goal: Knowledge of General Education information will improve Description: Including pain rating scale, medication(s)/side effects and non-pharmacologic comfort measures Outcome: Progressing   Problem: Clinical Measurements: Goal: Ability to maintain clinical measurements within normal limits will improve Outcome: Progressing   Problem: Clinical Measurements: Goal: Diagnostic test results will improve Outcome: Progressing   

## 2018-06-15 NOTE — Consult Note (Signed)
Keith Maynard is a 27 year old male first diagnosed with diabetes mellitus about two years ago when he was seen by his eye doctor.  He has subsequently received retinal laser therapy.  He reports occasional tingling in his toes.  He has known CKD with creat 1.69 in March 2019.  He presented to the ED c/o swelling in his LEs for about a month.  He is followed at the [facility] and in July 2019 renal ultrasound revealed a 10.5cm right and 11.6cm left kidney.  There was increased echogenicity. HIV is neg, Hgb 8.6.  On admission 7/20 BUN was 38 and creat 2.69, albumin 2.9, CO2 15. UA revealed > 351m/dl protein, 21-50 RBCs and 0 to 5 WBCs.  Past Medical History:  Diagnosis Date  . Asthma   . [place]   . Hypertension    History reviewed. No pertinent surgical history. Social History:  reports that he has been smoking cigarettes.  He has never used smokeless tobacco. He reports that he drinks alcohol. He reports that he does not use drugs. Allergies:  Allergies  Allergen Reactions  . Other Shortness Of Breath    All nuts except peanuts-itching,nausea   Family History  Problem Relation Age of Onset  . Diabetes Mother   . Stroke Father     Medications:  Scheduled: . amLODipine  10 mg Oral Daily  . insulin aspart  0-9 Units Subcutaneous TID WC  . latanoprost  1 drop Left Eye QHS  . losartan  50 mg Oral Daily  . prednisoLONE acetate  1 drop Left Eye TID AC & HS  . sodium chloride flush  3 mL Intravenous Q12H    ROS: as per HPI Blood pressure (!) 146/77, pulse 98, temperature 98.3 F (36.8 C), temperature source Oral, resp. rate 16, weight 62.6 kg (138 lb 0.1 oz), SpO2 100 %.  General appearance: alert and cooperative Head: Normocephalic, without obvious abnormality, atraumatic Eyes: negative  Fundi not examined Throat: lips, mucosa, and tongue normal; teeth and gums normal Resp: clear to auscultation bilaterally Chest wall: no  tenderness Cardio: regular rate and rhythm, S1, S2 normal, no murmur, click, rub or gallop GI: soft, non-tender; bowel sounds normal; no masses,  no organomegaly Extremities: edema tr to 1+ Skin: Skin color, texture, turgor normal. No rashes or lesions Neurologic: Sensory: normal, stocking Results for orders placed or performed during the hospital encounter of 06/14/18 (from the past 48 hour(s))  Urinalysis, Routine w reflex microscopic     Status: Abnormal   Collection Time: 06/15/18  1:10 AM  Result Value Ref Range   Color, Urine STRAW (A) YELLOW   APPearance CLEAR CLEAR   Specific Gravity, Urine 1.012 1.005 - 1.030   pH 5.0 5.0 - 8.0   Glucose, UA 50 (A) NEGATIVE mg/dL   Hgb urine dipstick LARGE (A) NEGATIVE   Bilirubin Urine NEGATIVE NEGATIVE   Ketones, ur 5 (A) NEGATIVE mg/dL   Protein, ur >=300 (A) NEGATIVE mg/dL   Nitrite NEGATIVE NEGATIVE   Leukocytes, UA NEGATIVE NEGATIVE   RBC / HPF 21-50 0 - 5 RBC/hpf   WBC, UA 0-5 0 - 5 WBC/hpf   Bacteria, UA NONE SEEN NONE SEEN   Squamous Epithelial / LPF 0-5 0 - 5   Mucus PRESENT     Comment: Performed at [facility] [address] [place] [address] Sodium, urine, random     Status: None   Collection Time: 06/15/18  1:10  AM  Result Value Ref Range   Sodium, Ur 89 mmol/L    Comment: Performed at [facility] [address], [place], [place] [address]  Creatinine, urine, random     Status: None   Collection Time: 06/15/18  1:10 AM  Result Value Ref Range   Creatinine, Urine 90.61 mg/dL    Comment: Performed at [facility] [address] [place], [place] [address]  Protein, urine, random     Status: None   Collection Time: 06/15/18  1:10 AM  Result Value Ref Range   Total Protein, Urine 452 mg/dL    Comment: NO NORMAL RANGE ESTABLISHED FOR THIS TEST RESULTS CONFIRMED BY MANUAL DILUTION Performed at [facility], [place] [address], [place], [place] [address]   Comprehensive metabolic panel      Status: Abnormal   Collection Time: 06/15/18  1:22 AM  Result Value Ref Range   Sodium 149 (H) 135 - 145 mmol/L   Potassium 5.2 (H) 3.5 - 5.1 mmol/L   Chloride 104 98 - 111 mmol/L    Comment: Please note change in reference range.   CO2 15 (L) 22 - 32 mmol/L   Glucose, Bld 166 (H) 70 - 99 mg/dL    Comment: Please note change in reference range.   BUN 38 (H) 6 - 20 mg/dL    Comment: Please note change in reference range.   Creatinine, Ser 2.69 (H) 0.61 - 1.24 mg/dL   Calcium 6.8 (L) 8.9 - 10.3 mg/dL   Total Protein 5.9 (L) 6.5 - 8.1 g/dL   Albumin 2.9 (L) 3.5 - 5.0 g/dL   AST 15 15 - 41 U/L   ALT 18 0 - 44 U/L    Comment: Please note change in reference range.   Alkaline Phosphatase 71 38 - 126 U/L   Total Bilirubin 0.6 0.3 - 1.2 mg/dL   GFR calc non Af Amer 31 (L) >60 mL/min   GFR calc Af Amer 36 (L) >60 mL/min    Comment: (NOTE) The eGFR has been calculated using the CKD EPI equation. This calculation has not been validated in all clinical situations. eGFR's persistently <60 mL/min signify possible Chronic Kidney Disease.    Anion gap 30 (H) 5 - 15    Comment: Performed at [facility], [place] [address], [place], [place] [address]  CBC with Differential/Platelet     Status: Abnormal   Collection Time: 06/15/18  1:45 AM  Result Value Ref Range   WBC 5.8 4.0 - 10.5 K/uL   RBC 3.01 (L) 4.22 - 5.81 MIL/uL   Hemoglobin 8.6 (L) 13.0 - 17.0 g/dL   HCT 28.0 (L) 39.0 - 52.0 %   MCV 93.0 78.0 - 100.0 fL   MCH 28.6 26.0 - 34.0 pg   MCHC 30.7 30.0 - 36.0 g/dL   RDW 13.9 11.5 - 15.5 %   Platelets 266 150 - 400 K/uL   Neutrophils Relative % 59 %   Neutro Abs 3.4 1.7 - 7.7 K/uL   Lymphocytes Relative 27 %   Lymphs Abs 1.5 0.7 - 4.0 K/uL   Monocytes Relative 6 %   Monocytes Absolute 0.4 0.1 - 1.0 K/uL   Eosinophils Relative 7 %   Eosinophils Absolute 0.4 0.0 - 0.7 K/uL   Basophils Relative 1 %   Basophils Absolute 0.1 0.0 - 0.1 K/uL   Immature Granulocytes 0 %   Abs  Immature Granulocytes 0.0 0.0 - 0.1 K/uL  Comment: Performed at [facility], [place] [address], [place], [place] [address]  D-dimer, quantitative (not at [facility])     Status: Abnormal   Collection Time: 06/15/18  1:45 AM  Result Value Ref Range   D-Dimer, Quant 0.77 (H) 0.00 - 0.50 ug/mL-FEU    Comment: (NOTE) At the manufacturer cut-off of 0.50 ug/mL FEU, this assay has been documented to exclude PE with a sensitivity and negative predictive value of 97 to 99%.  At this time, this assay has not been approved by the FDA to exclude DVT/VTE. Results should be correlated with clinical presentation. Performed at [facility], [place] [address] [place], [place] [address]   Brain natriuretic peptide     Status: None   Collection Time: 06/15/18  1:45 AM  Result Value Ref Range   B Natriuretic Peptide 90.0 0.0 - 100.0 pg/mL    Comment: Performed at [facility] [address] [place], [place] [address]  Hemoglobin A1c     Status: None   Collection Time: 06/15/18  7:01 AM  Result Value Ref Range   Hgb A1c MFr Bld 5.4 4.8 - 5.6 %    Comment: (NOTE) Pre diabetes:          5.7%-6.4% Diabetes:              >6.4% Glycemic control for   <7.0% adults with diabetes    Mean Plasma Glucose 108.28 mg/dL    Comment: Performed at [facility] [address], [place], [place] [address]  ABO/Rh     Status: None   Collection Time: 06/15/18  7:01 AM  Result Value Ref Range   ABO/RH(D)      A NEG Performed at [facility] [address] [place], [place] [address]   Type and screen [place]     Status: None   Collection Time: 06/15/18  7:02 AM  Result Value Ref Range   ABO/RH(D) A NEG    Antibody Screen NEG    Sample Expiration      06/18/2018 Performed at [facility], [place] [address], [place], [place] [address]   TSH     Status: None   Collection Time: 06/15/18  7:02 AM  Result Value Ref Range   TSH 1.738 0.350 - 4.500 uIU/mL     Comment: Performed by a 3rd Generation assay with a functional sensitivity of <=0.01 uIU/mL. Performed at [facility], [place] [address], [place], [place] [address]   Prealbumin     Status: None   Collection Time: 06/15/18  7:02 AM  Result Value Ref Range   Prealbumin 31.7 18 - 38 mg/dL    Comment: Performed at [facility] [address], [place],  [address]  Basic metabolic panel     Status: Abnormal   Collection Time: 06/15/18  8:45 AM  Result Value Ref Range   Sodium 142 135 - 145 mmol/L   Potassium 5.2 (H) 3.5 - 5.1 mmol/L   Chloride 113 (H) 98 - 111 mmol/L    Comment: Please note change in reference range.   CO2 21 (L) 22 - 32 mmol/L   Glucose, Bld 183 (H) 70 - 99 mg/dL    Comment: Please note change in reference range.   BUN 40 (H) 6 - 20 mg/dL    Comment: Please note change in reference range.   Creatinine, Ser 2.72 (H) 0.61 - 1.24 mg/dL  Calcium 8.5 (L) 8.9 - 10.3 mg/dL   GFR calc non Af Amer 31 (L) >60 mL/min   GFR calc Af Amer 35 (L) >60 mL/min    Comment: (NOTE) The eGFR has been calculated using the CKD EPI equation. This calculation has not been validated in all clinical situations. eGFR's persistently <60 mL/min signify possible Chronic Kidney Disease.    Anion gap 8 5 - 15    Comment: Performed at [facility] [address], [place], [place] [address]  Glucose, capillary     Status: None   Collection Time: 06/15/18 12:21 PM  Result Value Ref Range   Glucose-Capillary 99 70 - 99 mg/dL   Dg Chest 2 View  Result Date: 06/15/2018 CLINICAL DATA:  Initial evaluation for bilateral leg edema for 1 month, shortness of breath. EXAM: CHEST - 2 VIEW COMPARISON:  Prior radiograph from 01/04/16 FINDINGS: The cardiac and mediastinal silhouettes are stable in size and contour, and remain within normal limits. The lungs are normally inflated. No airspace consolidation, pleural effusion, or pulmonary edema is identified. There is no pneumothorax. No  acute osseous abnormality identified. IMPRESSION: No active cardiopulmonary disease. Electronically Signed   By: Jeannine Boga M.D.   On: 06/15/2018 01:52   US Renal  Result Date: 06/15/2018 CLINICAL DATA:  Initial evaluation for acute renal failure. EXAM: RENAL / URINARY TRACT ULTRASOUND COMPLETE COMPARISON:  None available. FINDINGS: Right Kidney: Length: 10.5 cm. Increased echogenicity within the renal parenchyma, consistent with medical renal disease. No mass or hydronephrosis visualized. Left Kidney: Length: 11.6 cm. Increased echogenicity within the renal parenchyma, compatible with medical renal disease. No mass or hydronephrosis visualized. Bladder: Appears normal for degree of bladder distention. IMPRESSION: 1. Increased echogenicity within the renal parenchyma, compatible with medical renal disease. 2. No hydronephrosis. Electronically Signed   By: Jeannine Boga M.D.   On: 06/15/2018 06:40    Assessment:  1 Stage 3 CKD, prob diabetic nephropathy but needs work up 2 Microhematuria prob due to #1 3 HBP 4 DM w/complications (ret/neuro/nep) 5 Volume excess due to #1 6 Anemia of chronic disease  Plan: 1 Diurese, low sodium diet 2 ANA, Complements, SPEP 3 BS and BP control, ongoing use of ACE or ARB 4 repeat UA 5 24 hr urine pro/cr 6 iron studies  Estanislado Emms, MD 06/15/2018, 4:32 PM

## 2018-06-15 NOTE — Progress Notes (Signed)
New Admission Note:   Arrival Method:  Mental Orientation: Alert and oriented x 4 Telemetry: 75M05 Assessment: completed Skin: assessed by 2 RNs, unremarkable IV: L wrist with NS at 125 mL/h Pain: 0/10 Tubes: none Safety Measures: assessment complete. Nonskid socks. Bed in low position. Requested patient call for help before getting out of bed. Admission: completed 75M Orientation: done Family: none at bedside Personal Belongings: clothing and cell phone  Orders have been reviewed and implemented. Will continue to monitor the patient. Call light has been placed within reach and bed alarm has been activated.   Hortencia ConradiWendi Shunsuke Granzow, RN MSN CNE [id number] Phone number: [phone]

## 2018-06-15 NOTE — H&P (Addendum)
History and Physical    Keith Bakernthony W Fuerstenberg WUJ:[id number] DOB: 1991/02/06 DOA: 06/14/2018  Referring MD/NP/PA: Jaci Carrelhristopher Pollina PCP: Loletta SpecterGomez, Roger David, PA-C  Patient coming from: Home  Chief Complaint: Lower extremity swelling  I have personally briefly reviewed patient's [facility]   HPI: Keith Maynard is a 27 y.o. male with medical history significant of HTN, DM type II, and mild intermittent asthma; who presented with complaints of lower extremity swelling over the last 1-1/2 months.  Associated symptoms include burning pain in the lower extremities that makes it difficult to ambulate, fatigue, and leg stiffness. Patient denies having any bleeding, dark stools, urinary frequency, change in weight, nausea, vomiting, chest pain, palpitations, diarrhea, abdominal pain, changes in vision, or headaches.  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  He normally keeps himself well-hydrated.  He is on Amaryl to control his diabetes, but only checks his blood sugars once every other week or so. He reports taking losartan and amlodipine has been on these medications for months now. Review of records shows that patient was evaluated for anemia with peripheral smear, and kidney disease including renal ultrasound approximately 3 months ago.  ED Course: Admission into the emergency department patient was seen to beAfebrile, pulse 10 4-106, respirations 16, blood pressure 153/88-159/91, and O2 saturation maintained on room air.  Labs revealed hemoglobin 8.6, sodium 149, potassium 5.2, CO2 15, BUN 38, creatinine 2.69, glucose 166, anion gap 30, calcium 6.8, albumin 2.9, and d-dimer 0.77.  Chest x-ray was negative for any acute abnormalities.     Review of Systems  Constitutional: Positive for malaise/fatigue. Negative for chills and fever.  HENT: Negative for ear discharge.   Eyes: Negative  for photophobia and pain.  Respiratory: Negative for cough and shortness of breath.   Cardiovascular: Positive for leg swelling. Negative for chest pain.  Gastrointestinal: Negative for diarrhea, nausea and vomiting.  Genitourinary: Negative for dysuria and frequency.  Musculoskeletal: Positive for myalgias. Negative for falls.  Neurological: Positive for sensory change. Negative for loss of consciousness and headaches.  Psychiatric/Behavioral: Negative for substance abuse.    Past Medical History:  Diagnosis Date  . Asthma   . Diabetes mellitus without complication (HCC)   . Hypertension     History reviewed. No pertinent surgical history.   reports that he has been smoking cigarettes.  He has never used smokeless tobacco. He reports that he drinks alcohol. He reports that he does not use drugs.  Allergies  Allergen Reactions  . Other Shortness Of Breath    All nuts except peanuts-itching,nausea    Family History  Problem Relation Age of Onset  . Diabetes Mother   . Stroke Father     Prior to Admission medications   Medication Sig Start Date End Date Taking? Authorizing Provider  albuterol (PROVENTIL HFA;VENTOLIN HFA) 108 (90 Base) MCG/ACT inhaler Inhale 2 puffs into the lungs every 4 (four) hours as needed for wheezing or shortness of breath. 05/06/18 06/15/18 Yes Loletta SpecterGomez, Roger David, PA-C  amLODipine (NORVASC) 10 MG tablet Take 1 tablet (10 mg total) by mouth daily. 03/22/18  Yes Loletta SpecterGomez, Roger David, PA-C  glimepiride (AMARYL) 4 MG tablet TAKE 1 TABLET (4 MG TOTAL) BY MOUTH DAILY BEFORE BREAKFAST. 06/13/18  Yes Loletta SpecterGomez, Roger David, PA-C  latanoprost (XALATAN) 0.005 % ophthalmic solution Place 1 drop into the left eye at bedtime.  Yes [provider]  losartan (COZAAR) 50 MG tablet Take 1 tablet (50 mg total) by mouth daily. 04/17/18  Yes Claiborne Rigg, NP  prednisoLONE acetate (PRED FORTE) 1 % ophthalmic suspension Place 1 drop into the left eye 4 (four) times daily.    Yes [provider]  glimepiride (AMARYL) 4 MG tablet TAKE 1 TABLET (4 MG TOTAL) BY MOUTH DAILY BEFORE BREAKFAST. Patient not taking: Reported on 06/15/2018 05/13/18   Loletta Specter, PA-C    Physical Exam:  Constitutional: Young male NAD, calm, comfortable Vitals:   06/14/18 2143 06/15/18 0023  BP: (!) 153/88 (!) 159/91  Pulse: (!) 104 (!) 106  Resp: 16 16  Temp: 98.1 F (36.7 C) 98.5 F (36.9 C)  TempSrc: Oral Oral  SpO2: 100% 100%   Eyes: PERRL, lids and conjunctivae normal ENMT: Mucous membranes are dry. Posterior pharynx clear of any exudate or lesions.  Neck: normal, supple, no masses, no thyromegaly Respiratory: clear to auscultation bilaterally, no wheezing, no crackles. Normal respiratory effort. No accessory muscle use.  Cardiovascular: Regular rate and rhythm, no murmurs / rubs / gallops.  2+ pitting bilateral lower extremity edema. 2+ pedal pulses. No carotid bruits.  Abdomen: no tenderness, no masses palpated. No hepatosplenomegaly. Bowel sounds positive.  Musculoskeletal: no clubbing / cyanosis. No joint deformity upper and lower extremities. Good ROM, no contractures. Normal muscle tone.  Skin: no rashes, lesions, ulcers. No induration Neurologic: CN 2-12 grossly intact. Sensation abnormal of the bilateral lower extreme, DTR normal. Strength 5/5 in all 4.  Psychiatric: Normal judgment and insight. Alert and oriented x 3. Normal mood.     Labs on Admission: I have personally reviewed following labs and imaging studies  CBC: Recent Labs  Lab 06/15/18 0145  WBC 5.8  NEUTROABS 3.4  HGB 8.6*  HCT 28.0*  MCV 93.0  PLT 266   Basic Metabolic Panel: Recent Labs  Lab 06/15/18 0122  NA 149*  K 5.2*  CL 104  CO2 15*  GLUCOSE 166*  BUN 38*  CREATININE 2.69*  CALCIUM 6.8*   GFR: CrCl cannot be calculated (Unknown ideal weight.). Liver Function Tests: Recent Labs  Lab 06/15/18 0122  AST 15  ALT 18  ALKPHOS 71  BILITOT 0.6  PROT 5.9*    ALBUMIN 2.9*   No results for input(s): LIPASE, AMYLASE in the last 168 hours. No results for input(s): AMMONIA in the last 168 hours. Coagulation Profile: No results for input(s): INR, PROTIME in the last 168 hours. Cardiac Enzymes: No results for input(s): CKTOTAL, CKMB, CKMBINDEX, TROPONINI in the last 168 hours. BNP (last 3 results) No results for input(s): PROBNP in the last 8760 hours. HbA1C: No results for input(s): HGBA1C in the last 72 hours. CBG: No results for input(s): GLUCAP in the last 168 hours. Lipid Profile: No results for input(s): CHOL, HDL, LDLCALC, TRIG, CHOLHDL, LDLDIRECT in the last 72 hours. Thyroid Function Tests: No results for input(s): TSH, T4TOTAL, FREET4, T3FREE, THYROIDAB in the last 72 hours. Anemia Panel: No results for input(s): VITAMINB12, FOLATE, FERRITIN, TIBC, IRON, RETICCTPCT in the last 72 hours. Urine analysis:    Component Value Date/Time   COLORURINE STRAW (A) 06/15/2018 0110   APPEARANCEUR CLEAR 06/15/2018 0110   LABSPEC 1.012 06/15/2018 0110   PHURINE 5.0 06/15/2018 0110   GLUCOSEU 50 (A) 06/15/2018 0110   HGBUR LARGE (A) 06/15/2018 0110   BILIRUBINUR NEGATIVE 06/15/2018 0110   KETONESUR 5 (A) 06/15/2018 0110   PROTEINUR >=300 (A) 06/15/2018 0110  NITRITE NEGATIVE 06/15/2018 0110   LEUKOCYTESUR NEGATIVE 06/15/2018 0110   Sepsis Labs: No results found for this or any previous visit (from the past 240 hour(s)).   Radiological Exams on Admission: Dg Chest 2 View  Result Date: 06/15/2018 CLINICAL DATA:  Initial evaluation for bilateral leg edema for 1 month, shortness of breath. EXAM: CHEST - 2 VIEW COMPARISON:  Prior radiograph from 01/04/16 FINDINGS: The cardiac and mediastinal silhouettes are stable in size and contour, and remain within normal limits. The lungs are normally inflated. No airspace consolidation, pleural effusion, or pulmonary edema is identified. There is no pneumothorax. No acute osseous abnormality identified.  IMPRESSION: No active cardiopulmonary disease. Electronically Signed   By: Rise Mu M.D.   On: 06/15/2018 01:52    EKG: Independently reviewed.  Sinus rhythm at 95 bpm  Assessment/Plan  Acute renal failure: Patient presents with a creatinine of 2.69 with BUN elevated at 38.  Patient's baseline creatinine is is not known.  Urinalysis shows significant proteinuria and hematuria.  Suspect dehydration with elevated sodium level vs. nephrotic syndrome vs. HTN induced vs. Diabetes related. - Admit to a telemetry bed - Strict I&O's - Check renal ultrasound - Check urine sodium, protein, creatinine - Bolus 1 L of normal saline IV fluid, then 125 mL/h - Follow-up with PCP in a.m. to get previous labs to establish baseline - Will need to likely consult nephrology in a.m.  Metabolic acidosis with elevated anion gap: Initial CO2 15 with anion gap of 30. - Continue to monitor  Hypernatremia: Acute.  Initial sodium 149 on admission.  Suspect dehydration. - IV fluids as seen above  Bilateral lower extremity swelling: Patient presents with bilateral lower extremity swelling over the last 1-1/2 months.  D-dimer elevated at 0.77 and albumin low at 2.9. Patient with no respiratory symptoms.Rule out possibility of underlying clot.    - Check vascular Doppler ultrasound - Check TSH  - Check prealbumin  Normocytic normochromic anemia: Hemoglobin 8.6 on admission.  Patient denies any dark or black stools or bleeding issues.  Question if secondary to kidney disease. - Check stool guaiac - Type and screen for possible need of blood products  Essential hypertension - Continue losartan and amlodipine  Diabetes mellitus type 2 with peripheral neuropathy: Patient reports good glucose control currently only on oral medications of Amaryl.  Patient does not routinely check his blood sugars.  On admission patient's blood glucose 166.  - Check hemoglobin A1c - Hypoglycemic protocols - Hold Amaryl -  CBGs q. before meals with sensitive SSI - Consider starting gabapentin for peripheral neuropathy  Hyperkalemia: Acute.  Mild elevation of potassium to 5.2.   - IV fluids as seen above - Continue to monitor   Hypocalcemia: Initial calcium level 6.8 - Give 2 g of calcium gluconate - Continue to monitor and replace as needed  Mild intermittent asthma: Stable  DVT prophylaxis: Will need to address  Code Status: Full Family Communication: No family present at bedside Disposition Plan: Discharge home in 2 to 3 days once medically stable Consults called: None Admission status: Inpatient  Clydie Braun MD [facility] Pager [phone]   If 7PM-7AM, please contact night-coverage [email or web address] Password [facility]  06/15/2018, 4:45 AM

## 2018-06-15 NOTE — Progress Notes (Signed)
Bilateral lower extremity venous duplex has been completed. Negative for DVT.  06/15/18 3:34 PM Olen CordialGreg Carisa Backhaus RVT

## 2018-06-16 DIAGNOSIS — R3129 Other microscopic hematuria: Secondary | ICD-10-CM

## 2018-06-16 DIAGNOSIS — E119 Type 2 diabetes mellitus without complications: Secondary | ICD-10-CM

## 2018-06-16 DIAGNOSIS — R809 Proteinuria, unspecified: Secondary | ICD-10-CM

## 2018-06-16 LAB — PROTEIN, URINE, 24 HOUR
Collection Interval-UPROT: 24 hours
PROTEIN, 24H URINE: 6560 mg/d — AB (ref 50–100)
Protein, Urine: 205 mg/dL
Urine Total Volume-UPROT: 3200 mL

## 2018-06-16 LAB — URINALYSIS, ROUTINE W REFLEX MICROSCOPIC
BILIRUBIN URINE: NEGATIVE
Bacteria, UA: NONE SEEN
GLUCOSE, UA: 50 mg/dL — AB
Ketones, ur: NEGATIVE mg/dL
LEUKOCYTES UA: NEGATIVE
NITRITE: NEGATIVE
PROTEIN: 100 mg/dL — AB
Specific Gravity, Urine: 1.011 (ref 1.005–1.030)
pH: 5 (ref 5.0–8.0)

## 2018-06-16 LAB — GLUCOSE, CAPILLARY
GLUCOSE-CAPILLARY: 108 mg/dL — AB (ref 70–99)
GLUCOSE-CAPILLARY: 179 mg/dL — AB (ref 70–99)
Glucose-Capillary: 83 mg/dL (ref 70–99)
Glucose-Capillary: 81 mg/dL (ref 70–99)

## 2018-06-16 LAB — IRON AND TIBC
IRON: 65 ug/dL (ref 45–182)
Saturation Ratios: 27 % (ref 17.9–39.5)
TIBC: 237 ug/dL — ABNORMAL LOW (ref 250–450)
UIBC: 172 ug/dL

## 2018-06-16 LAB — BASIC METABOLIC PANEL
Anion gap: 7 (ref 5–15)
BUN: 30 mg/dL — ABNORMAL HIGH (ref 6–20)
CHLORIDE: 112 mmol/L — AB (ref 98–111)
CO2: 21 mmol/L — AB (ref 22–32)
Calcium: 8 mg/dL — ABNORMAL LOW (ref 8.9–10.3)
Creatinine, Ser: 2.23 mg/dL — ABNORMAL HIGH (ref 0.61–1.24)
GFR calc non Af Amer: 39 mL/min — ABNORMAL LOW (ref >60)
GFR, EST AFRICAN AMERICAN: 45 mL/min — AB (ref >60)
Glucose, Bld: 82 mg/dL (ref 70–99)
POTASSIUM: 4 mmol/L (ref 3.5–5.1)
SODIUM: 140 mmol/L (ref 135–145)

## 2018-06-16 LAB — FERRITIN: Ferritin: 204 ng/mL (ref 24–336)

## 2018-06-16 LAB — CREATININE, URINE, 24 HOUR
CREATININE 24H UR: 1627 mg/d (ref 800–2000)
Collection Interval-UCRE24: 24 hours
Creatinine, Urine: 50.85 mg/dL
URINE TOTAL VOLUME-UCRE24: 3200 mL

## 2018-06-16 LAB — OCCULT BLOOD X 1 CARD TO LAB, STOOL: Fecal Occult Bld: NEGATIVE

## 2018-06-16 NOTE — Progress Notes (Signed)
PROGRESS NOTE    Keith Maynard  ZOX:[id number] DOB: 08/10/1991 DOA: 06/14/2018 PCP: Loletta Specter, PA-C    Brief Narrative: Keith Coopersmith Meadowsis a 26 y.o.malewith medical history significant ofHTN, DM type II, and mild intermittent asthma;who presented withcomplaints oflower extremity swelling over the last 1-1/38months, associated with polyuria, increased frequency of urination. Labs were abnormal with elevated creatinine and BUN, elevated potassium and sodium. He was admitted for evaluation of AKI with metabolic acidosis.  UA significant for proteinuria, large hemoglobin, and glucose, without any signs of infection, no casts. His FeNa IS 1.9, suspicion of intrinsic renal failure . His albumin is 2.9, total protein is 5.9.     Assessment & Plan:   Principal Problem:   ARF (acute renal failure) (HCC) Active Problems:   Asthma   Metabolic acidosis   Hypernatremia   Bilateral lower extremity edema   Normocytic normochromic anemia   Essential hypertension   Hyperkalemia   Hypocalcemia   AKI with metabolic acidosis. US RENAL ruled out hydronephrosis.  Rule out nephropathy.  Appreciate nephrology consult and recommendations.  ANA, complements SPEP pending.  24hour urine in progress.  Repeat bmp this am is pending.  Pt asymptomatic.    Hyperkalemia:  Kayexalate given yesterday.  Repeat BMP pending.     Hypernatremia:  Resolved.   Anemia of chronic disease  Hemoglobin around 8.6 anemia panel shows normal iron levels and ferritin levels.  Transfuse to keep hemoglobin greater than 7.  No signs of bleeding.    Diabetes mellitus: CBG (last 3)  Recent Labs    06/15/18 1647 06/15/18 2153 06/16/18 0756  GLUCAP 129* 99 83   Resume SSI. A1c is 5.4    Asthma:  No wheezing heard.    Hypertension:  Sub optimal.  On Norvasc, lasix, and losartan.     DVT prophylaxis: scd's Code Status: FULL CODE.  Family Communication: NONE AT BEDSIDE.    Disposition Plan: pending further evaluation.   Consultants:   Nephrology Dr Lowell Guitar    Procedures: none    Antimicrobials: none   Subjective: No nausea, vomiting or abdominal pain.   Objective: Vitals:   06/15/18 1603 06/15/18 2155 06/16/18 0558 06/16/18 0758  BP: (!) 146/77 (!) 154/94 (!) 141/88 (!) 142/85  Pulse: 98 (!) 101 93 95  Resp: 16 14 16 16   Temp: 98.3 F (36.8 C) 98.6 F (37 C) 98 F (36.7 C) 98.4 F (36.9 C)  TempSrc: Oral Oral Oral Oral  SpO2: 100% 100% 99% 100%  Weight:        Intake/Output Summary (Last 24 hours) at 06/16/2018 0929 Last data filed at 06/16/2018 0700 Gross per 24 hour  Intake 3344.6 ml  Output 1850 ml  Net 1494.6 ml   Filed Weights   06/15/18 0815  Weight: 62.6 kg (138 lb 0.1 oz)    Examination:  General exam: Appears calm and comfortable  Respiratory system: Clear to auscultation. Respiratory effort normal. Cardiovascular system: S1 & S2 heard, RRR. No JVD, murmurs,  Gastrointestinal system: Abdomen is nondistended, soft and nontender. No organomegaly or masses felt. Normal bowel sounds heard. Central nervous system: Alert and oriented. No focal neurological deficits. Extremities: Symmetric 5 x 5 power. Skin: No rashes, lesions or ulcers Psychiatry: . Mood & affect appropriate.     Data Reviewed: I have personally reviewed following labs and imaging studies  CBC: Recent Labs  Lab 06/15/18 0145  WBC 5.8  NEUTROABS 3.4  HGB 8.6*  HCT 28.0*  MCV 93.0  PLT 266  Basic Metabolic Panel: Recent Labs  Lab 06/15/18 0122 06/15/18 0845  NA 149* 142  K 5.2* 5.2*  CL 104 113*  CO2 15* 21*  GLUCOSE 166* 183*  BUN 38* 40*  CREATININE 2.69* 2.72*  CALCIUM 6.8* 8.5*   GFR: Estimated Creatinine Clearance: 36.4 mL/min (A) (by C-G formula based on SCr of 2.72 mg/dL (H)). Liver Function Tests: Recent Labs  Lab 06/15/18 0122  AST 15  ALT 18  ALKPHOS 71  BILITOT 0.6  PROT 5.9*  ALBUMIN 2.9*   No results for  input(s): LIPASE, AMYLASE in the last 168 hours. No results for input(s): AMMONIA in the last 168 hours. Coagulation Profile: No results for input(s): INR, PROTIME in the last 168 hours. Cardiac Enzymes: No results for input(s): CKTOTAL, CKMB, CKMBINDEX, TROPONINI in the last 168 hours. BNP (last 3 results) No results for input(s): PROBNP in the last 8760 hours. HbA1C: Recent Labs    06/15/18 0701  HGBA1C 5.4   CBG: Recent Labs  Lab 06/15/18 1221 06/15/18 1647 06/15/18 2153 06/16/18 0756  GLUCAP 99 129* 99 83   Lipid Profile: No results for input(s): CHOL, HDL, LDLCALC, TRIG, CHOLHDL, LDLDIRECT in the last 72 hours. Thyroid Function Tests: Recent Labs    06/15/18 0702  TSH 1.738   Anemia Panel: Recent Labs    06/16/18 0623  FERRITIN 204  TIBC 237*  IRON 65   Sepsis Labs: No results for input(s): PROCALCITON, LATICACIDVEN in the last 168 hours.  No results found for this or any previous visit (from the past 240 hour(s)).       Radiology Studies: Dg Chest 2 View  Result Date: 06/15/2018 CLINICAL DATA:  Initial evaluation for bilateral leg edema for 1 month, shortness of breath. EXAM: CHEST - 2 VIEW COMPARISON:  Prior radiograph from 01/04/16 FINDINGS: The cardiac and mediastinal silhouettes are stable in size and contour, and remain within normal limits. The lungs are normally inflated. No airspace consolidation, pleural effusion, or pulmonary edema is identified. There is no pneumothorax. No acute osseous abnormality identified. IMPRESSION: No active cardiopulmonary disease. Electronically Signed   By: Rise MuBenjamin  Maynard M.D.   On: 06/15/2018 01:52   [place] Renal  Result Date: 06/15/2018 CLINICAL DATA:  Initial evaluation for acute renal failure. EXAM: RENAL / URINARY TRACT ULTRASOUND COMPLETE COMPARISON:  None available. FINDINGS: Right Kidney: Length: 10.5 cm. Increased echogenicity within the renal parenchyma, consistent with medical renal disease. No mass or  hydronephrosis visualized. Left Kidney: Length: 11.6 cm. Increased echogenicity within the renal parenchyma, compatible with medical renal disease. No mass or hydronephrosis visualized. Bladder: Appears normal for degree of bladder distention. IMPRESSION: 1. Increased echogenicity within the renal parenchyma, compatible with medical renal disease. 2. No hydronephrosis. Electronically Signed   By: Rise MuBenjamin  Maynard M.D.   On: 06/15/2018 06:40        Scheduled Meds: . amLODipine  10 mg Oral Daily  . furosemide  40 mg Oral Daily  . insulin aspart  0-9 Units Subcutaneous TID WC  . latanoprost  1 drop Left Eye QHS  . losartan  50 mg Oral Daily  . prednisoLONE acetate  1 drop Left Eye TID AC & HS  . sodium chloride flush  3 mL Intravenous Q12H   Continuous Infusions: . sodium chloride 125 mL/hr at 06/16/18 0206     LOS: 1 day    Time spent: 35 minutes.    Kathlen ModyVijaya Hannah Crill, MD [facility] Pager [phone]  If 7PM-7AM, please contact night-coverage [email or web address] Password [email or web address]  06/16/2018, 9:29 AM

## 2018-06-16 NOTE — Plan of Care (Signed)
  Problem: Clinical Measurements: Goal: Diagnostic test results will improve Outcome: Progressing   Problem: Metabolic: Goal: Ability to maintain appropriate glucose levels will improve Outcome: Progressing

## 2018-06-16 NOTE — Progress Notes (Signed)
Assessment:  1 Stage 3 CKD, prob diabetic nephropathy but needs work up 2 Microhematuria prob due to #1 3 HBP 4 DM w/complications (ret/neuro/nep) 5 Volume excess due to #1 6 Anemia of chronic disease & mild iron defic 7 Proteinuria 6556m/24hr  Plan: 1 feraheme IV 2 I think it is reasonable to do a renal biopsy, but I think it is likely diabetic in origin.  The microhematuria could be IgA but I suspect it is also from the DM.  Will schedule and I discussed with him via telephone. 3 OK to resume ACE or ARB   Subjective: Interval History: Protein= 6,5639m24 hours   Objective: Vital signs in last 24 hours: Temp:  [98 F (36.7 C)-98.6 F (37 C)] 98.4 F (36.9 C) (07/21 0758) Pulse Rate:  [93-101] 95 (07/21 0758) Resp:  [14-16] 16 (07/21 0758) BP: (141-154)/(77-94) 142/85 (07/21 0758) SpO2:  [99 %-100 %] 100 % (07/21 0758) Weight change:   Intake/Output from previous day: 07/20 0701 - 07/21 0700 In: 4272.1 [P.O.:780; I.V.:3391; IV Piggyback:101.1] Out: 1850 [Urine:1850] Intake/Output this shift: Total I/O In: 299.1 [I.V.:299.1] Out: 200 [Urine:200]  General appearance: alert and cooperative Resp: clear to auscultation bilaterally Cardio: regular rate and rhythm, S1, S2 normal, no murmur, click, rub or gallop GI: soft, non-tender; bowel sounds normal; no masses,  no organomegaly Extremities: extremities normal, atraumatic, no cyanosis or edema  Lab Results: Recent Labs    06/15/18 0145  WBC 5.8  HGB 8.6*  HCT 28.0*  PLT 266   BMET:  Recent Labs    06/15/18 0845 06/16/18 0623  NA 142 140  K 5.2* 4.0  CL 113* 112*  CO2 21* 21*  GLUCOSE 183* 82  BUN 40* 30*  CREATININE 2.72* 2.23*  CALCIUM 8.5* 8.0*   No results for input(s): PTH in the last 72 hours. Iron Studies:  Recent Labs    06/16/18 0623  IRON 65  TIBC 237*  FERRITIN 204   Studies/Results: Dg Chest 2 View  Result Date: 06/15/2018 CLINICAL DATA:  Initial evaluation for bilateral leg  edema for 1 month, shortness of breath. EXAM: CHEST - 2 VIEW COMPARISON:  Prior radiograph from 01/04/16 FINDINGS: The cardiac and mediastinal silhouettes are stable in size and contour, and remain within normal limits. The lungs are normally inflated. No airspace consolidation, pleural effusion, or pulmonary edema is identified. There is no pneumothorax. No acute osseous abnormality identified. IMPRESSION: No active cardiopulmonary disease. Electronically Signed   By: BeJeannine Boga.D.   On: 06/15/2018 01:52   [place]  Result Date: 06/15/2018 CLINICAL DATA:  Initial evaluation for acute renal failure. EXAM: RENAL / URINARY TRACT ULTRASOUND COMPLETE COMPARISON:  None available. FINDINGS: Right Kidney: Length: 10.5 cm. Increased echogenicity within the renal parenchyma, consistent with medical renal disease. No mass or hydronephrosis visualized. Left Kidney: Length: 11.6 cm. Increased echogenicity within the renal parenchyma, compatible with medical renal disease. No mass or hydronephrosis visualized. Bladder: Appears normal for degree of bladder distention. IMPRESSION: 1. Increased echogenicity within the renal parenchyma, compatible with medical renal disease. 2. No hydronephrosis. Electronically Signed   By: BeJeannine Boga.D.   On: 06/15/2018 06:40    Scheduled: . amLODipine  10 mg Oral Daily  . furosemide  40 mg Oral Daily  . insulin aspart  0-9 Units Subcutaneous TID WC  . latanoprost  1 drop Left Eye QHS  . losartan  50 mg Oral Daily  . prednisoLONE acetate  1 drop Left Eye TID AC & HS  .  sodium chloride flush  3 mL Intravenous Q12H     LOS: 1 day   Estanislado Emms 06/16/2018,12:47 PM

## 2018-06-17 DIAGNOSIS — R809 Proteinuria, unspecified: Secondary | ICD-10-CM

## 2018-06-17 DIAGNOSIS — N179 Acute kidney failure, unspecified: Secondary | ICD-10-CM

## 2018-06-17 DIAGNOSIS — R3129 Other microscopic hematuria: Secondary | ICD-10-CM

## 2018-06-17 LAB — BASIC METABOLIC PANEL
Anion gap: 6 (ref 5–15)
BUN: 29 mg/dL — AB (ref 6–20)
CO2: 23 mmol/L (ref 22–32)
Calcium: 8.3 mg/dL — ABNORMAL LOW (ref 8.9–10.3)
Chloride: 112 mmol/L — ABNORMAL HIGH (ref 98–111)
Creatinine, Ser: 2.38 mg/dL — ABNORMAL HIGH (ref 0.61–1.24)
GFR calc Af Amer: 42 mL/min — ABNORMAL LOW (ref >60)
GFR calc non Af Amer: 36 mL/min — ABNORMAL LOW (ref >60)
Glucose, Bld: 76 mg/dL (ref 70–99)
Potassium: 4.1 mmol/L (ref 3.5–5.1)
Sodium: 141 mmol/L (ref 135–145)

## 2018-06-17 LAB — PROTEIN ELECTROPHORESIS, SERUM
A/G Ratio: 1 (ref 0.7–1.7)
ALPHA-1-GLOBULIN: 0.2 g/dL (ref 0.0–0.4)
Albumin ELP: 2.6 g/dL — ABNORMAL LOW (ref 2.9–4.4)
Alpha-2-Globulin: 0.7 g/dL (ref 0.4–1.0)
BETA GLOBULIN: 0.9 g/dL (ref 0.7–1.3)
Gamma Globulin: 0.8 g/dL (ref 0.4–1.8)
Globulin, Total: 2.6 g/dL (ref 2.2–3.9)
Total Protein ELP: 5.2 g/dL — ABNORMAL LOW (ref 6.0–8.5)

## 2018-06-17 LAB — GLUCOSE, CAPILLARY
GLUCOSE-CAPILLARY: 161 mg/dL — AB (ref 70–99)
Glucose-Capillary: 69 mg/dL — ABNORMAL LOW (ref 70–99)
Glucose-Capillary: 91 mg/dL (ref 70–99)
Glucose-Capillary: 129 mg/dL — ABNORMAL HIGH (ref 70–99)
Glucose-Capillary: 272 mg/dL — ABNORMAL HIGH (ref 70–99)

## 2018-06-17 LAB — C4 COMPLEMENT: Complement C4, Body Fluid: 34 mg/dL (ref 14–44)

## 2018-06-17 LAB — C3 COMPLEMENT: C3 Complement: 111 mg/dL (ref 82–167)

## 2018-06-17 MED ORDER — DEXTROSE 50 % IV SOLN
INTRAVENOUS | Status: AC
  Administered 2018-06-17: 25 mL
  Filled 2018-06-17: qty 50

## 2018-06-17 NOTE — Progress Notes (Signed)
Pt CBG 69, pt is NPO for renal biopsy.  Giving D50 per hypoglycemia protocol.

## 2018-06-17 NOTE — Progress Notes (Signed)
Paged Dr. Maggie SchwalbeBruenner, pt will probably not have biopsy today.  They will be up to discuss biopsy with patient.  T/O OK for patient to eat today.

## 2018-06-17 NOTE — Progress Notes (Signed)
Chief Complaint: Patient was seen in consultation today for renal biopsy at the request of Dr. Casimiro NeedleAlvin Powell  Referring Physician(s): Dr. Casimiro NeedleAlvin Powell  Supervising Physician: Richarda OverlieHenn, Adam  [facility]  History of Present Illness: Keith Maynard is a 27 y.o. male admitted with proteinuria and renal insufficiency. After being seen by Nephrology, IR is asked to perform random renal biopsy. PMHx, meds, labs, imaging, allergies reviewed. Otherwise feels well, no recent fevers, chill.    Past Medical History:  Diagnosis Date  . Asthma   . Diabetes mellitus without complication (HCC)   . Hypertension     History reviewed. No pertinent surgical history.  Allergies: Other  Medications:  Current Facility-Administered Medications:  .  acetaminophen (TYLENOL) tablet 650 mg, 650 mg, Oral, Q6H PRN **OR** acetaminophen (TYLENOL) suppository 650 mg, 650 mg, Rectal, Q6H PRN, Smith, Rondell A, MD .  albuterol (PROVENTIL) (2.5 MG/3ML) 0.083% nebulizer solution 2.5 mg, 2.5 mg, Nebulization, Q4H PRN, Smith, Rondell A, MD .  amLODipine (NORVASC) tablet 10 mg, 10 mg, Oral, Daily, Smith, Rondell A, MD, 10 mg at 06/17/18 1059 .  furosemide (LASIX) tablet 40 mg, 40 mg, Oral, Daily, Lauris PoagPowell, Alvin C, MD, 40 mg at 06/17/18 1059 .  hydrALAZINE (APRESOLINE) injection 10 mg, 10 mg, Intravenous, Q4H PRN, Smith, Rondell A, MD .  insulin aspart (novoLOG) injection 0-9 Units, 0-9 Units, Subcutaneous, TID WC, Madelyn FlavorsSmith, Rondell A, MD, 2 Units at 06/16/18 1804 .  latanoprost (XALATAN) 0.005 % ophthalmic solution 1 drop, 1 drop, Left Eye, QHS, Smith, Rondell A, MD, 1 drop at 06/16/18 2046 .  losartan (COZAAR) tablet 50 mg, 50 mg, Oral, Daily, Smith, Rondell A, MD, 50 mg at 06/17/18 1059 .  ondansetron (ZOFRAN) tablet 4 mg, 4 mg, Oral, Q6H PRN **OR** ondansetron (ZOFRAN) injection 4 mg, 4 mg, Intravenous, Q6H PRN, Smith, Rondell A, MD .  prednisoLONE acetate (PRED FORTE) 1 % ophthalmic suspension  1 drop, 1 drop, Left Eye, TID AC & HS, Smith, Rondell A, MD, 1 drop at 06/17/18 0940 .  sodium chloride flush (NS) 0.9 % injection 3 mL, 3 mL, Intravenous, Q12H, Smith, Rondell A, MD, 3 mL at 06/17/18 [id number]    Family History  Problem Relation Age of Onset  . Diabetes Mother   . Stroke Father     Social History   Socioeconomic History  . Marital status: Single    Spouse name: Not on file  . Number of children: Not on file  . Years of education: Not on file  . Highest education level: Not on file  Occupational History  . Not on file  Social Needs  . Financial resource strain: Not on file  . Food insecurity:    Worry: Not on file    Inability: Not on file  . Transportation needs:    Medical: Not on file    Non-medical: Not on file  Tobacco Use  . Smoking status: Light Tobacco Smoker    Types: Cigarettes  . Smokeless tobacco: Never Used  Substance and Sexual Activity  . Alcohol use: Yes    Alcohol/week: 0.0 oz  . Drug use: No  . Sexual activity: Not Currently  Lifestyle  . Physical activity:    Days per week: Not on file    Minutes per session: Not on file  . Stress: Not on file  Relationships  . Social connections:    Talks on phone: Not on file    Gets together: Not on file  Attends religious service: Not on file    Active member of club or organization: Not on file    Attends meetings of clubs or organizations: Not on file    Relationship status: Not on file  Other Topics Concern  . Not on file  Social History Narrative  . Not on file     Review of Systems: A 12 point ROS discussed and pertinent positives are indicated in the HPI above.  All other systems are negative.  Review of Systems  Vital Signs: BP (!) 160/93 (BP Location: Right Arm)   Pulse 93   Temp 98.6 F (37 C) (Oral)   Resp 18   Wt 138 lb 0.1 oz (62.6 kg)   SpO2 100%   BMI 21.62 kg/m   Physical Exam  Constitutional: He is oriented to person, place, and time. He appears  well-developed. No distress.  HENT:  Head: Normocephalic.  Mouth/Throat: Oropharynx is clear and moist.  Cardiovascular: Normal rate, regular rhythm and normal heart sounds.  Pulmonary/Chest: Effort normal and breath sounds normal. No respiratory distress.  Abdominal: Soft. He exhibits no distension. There is no tenderness.  Neurological: He is alert and oriented to person, place, and time.  Skin: Skin is warm and dry.  Psychiatric: He has a normal mood and affect.    Imaging: Dg Chest 2 View  Result Date: 06/15/2018 CLINICAL DATA:  Initial evaluation for bilateral leg edema for 1 month, shortness of breath. EXAM: CHEST - 2 VIEW COMPARISON:  Prior radiograph from 01/04/16 FINDINGS: The cardiac and mediastinal silhouettes are stable in size and contour, and remain within normal limits. The lungs are normally inflated. No airspace consolidation, pleural effusion, or pulmonary edema is identified. There is no pneumothorax. No acute osseous abnormality identified. IMPRESSION: No active cardiopulmonary disease. Electronically Signed   By: Rise Mu M.D.   On: 06/15/2018 01:52   US Renal  Result Date: 06/15/2018 CLINICAL DATA:  Initial evaluation for acute renal failure. EXAM: RENAL / URINARY TRACT ULTRASOUND COMPLETE COMPARISON:  None available. FINDINGS: Right Kidney: Length: 10.5 cm. Increased echogenicity within the renal parenchyma, consistent with medical renal disease. No mass or hydronephrosis visualized. Left Kidney: Length: 11.6 cm. Increased echogenicity within the renal parenchyma, compatible with medical renal disease. No mass or hydronephrosis visualized. Bladder: Appears normal for degree of bladder distention. IMPRESSION: 1. Increased echogenicity within the renal parenchyma, compatible with medical renal disease. 2. No hydronephrosis. Electronically Signed   By: Rise Mu M.D.   On: 06/15/2018 06:40    Labs:  CBC: Recent Labs    02/22/18 0939 03/22/18 0926  06/15/18 0145  WBC 5.5 CANCELED 5.8  HGB 10.2* CANCELED 8.6*  HCT 31.2* CANCELED 28.0*  PLT 308 CANCELED 266    COAGS: No results for input(s): INR, APTT in the last 8760 hours.  BMP: Recent Labs    06/15/18 0122 06/15/18 0845 06/16/18 0623 06/17/18 0746  NA 149* 142 140 141  K 5.2* 5.2* 4.0 4.1  CL 104 113* 112* 112*  CO2 15* 21* 21* 23  GLUCOSE 166* 183* 82 76  BUN 38* 40* 30* 29*  CALCIUM 6.8* 8.5* 8.0* 8.3*  CREATININE 2.69* 2.72* 2.23* 2.38*  GFRNONAA 31* 31* 39* 36*  GFRAA 36* 35* 45* 42*    LIVER FUNCTION TESTS: Recent Labs    02/22/18 0939 06/15/18 0122  BILITOT 0.4 0.6  AST 9 15  ALT 12 18  ALKPHOS 80 71  PROT 6.1 5.9*  ALBUMIN 3.4* 2.9*  TUMOR MARKERS: No results for input(s): AFPTM, CEA, CA199, CHROMGRNA in the last 8760 hours.  Assessment and Plan: Proteinuria, AKI For US guided random renal biopsy Tent plan tomorrow am. NPO p MN Risks and benefits discussed with the patient including, but not limited to bleeding, infection, damage to adjacent structures or low yield requiring additional tests.  All of the patient's questions were answered, patient is agreeable to proceed. Consent signed and in chart.    Thank you for this interesting consult.  I greatly enjoyed meeting KERRIGAN GLENDENING and look forward to participating in their care.  A copy of this report was sent to the requesting provider on this date.  Electronically Signed: Brayton El, PA-C 06/17/2018, 11:34 AM   I spent a total of 20 minutes in face to face in clinical consultation, greater than 50% of which was counseling/coordinating care for renal biopsy

## 2018-06-17 NOTE — Progress Notes (Signed)
Recheck CBG 91 gave 25 ml D50.

## 2018-06-17 NOTE — Progress Notes (Addendum)
[facility] ROUNDING NOTE   Subjective:   Interval History: has no complaint today. He is in agreement with potentially having the biopsy today to better determine the etiology of his renal disease.   Objective:  Vital signs in last 24 hours:  Temp:  [98.6 F (37 C)-98.7 F (37.1 C)] 98.6 F (37 C) (07/22 0800) Pulse Rate:  [93-101] 93 (07/22 0800) Resp:  [16-18] 18 (07/22 0800) BP: (137-160)/(85-93) 160/93 (07/22 0800) SpO2:  [100 %] 100 % (07/22 0800)  Weight change:  Filed Weights   06/15/18 0815  Weight: 138 lb 0.1 oz (62.6 kg)   Intake/Output: I/O last 3 completed shifts: In: 2189.4 [P.O.:240; I.V.:1949.4] Out: 2725 [Urine:2725]   Intake/Output this shift:  Total I/O In: 240 [P.O.:240] Out: -   Physical Exam: General: A/O x 4, in no acute distress, afebrile, nondiaphoretic. CVS- RRR no mrg auscultated RS- CTA bilaterally  ABD- BS present soft non-distended EXT- no edema bilaterally   Basic Metabolic Panel: Recent Labs  Lab 06/15/18 0122 06/15/18 0845 06/16/18 0623 06/17/18 0746  NA 149* 142 140 141  K 5.2* 5.2* 4.0 4.1  CL 104 113* 112* 112*  CO2 15* 21* 21* 23  GLUCOSE 166* 183* 82 76  BUN 38* 40* 30* 29*  CREATININE 2.69* 2.72* 2.23* 2.38*  CALCIUM 6.8* 8.5* 8.0* 8.3*    Liver Function Tests: Recent Labs  Lab 06/15/18 0122  AST 15  ALT 18  ALKPHOS 71  BILITOT 0.6  PROT 5.9*  ALBUMIN 2.9*   No results for input(s): LIPASE, AMYLASE in the last 168 hours. No results for input(s): AMMONIA in the last 168 hours.  CBC: Recent Labs  Lab 06/15/18 0145  WBC 5.8  NEUTROABS 3.4  HGB 8.6*  HCT 28.0*  MCV 93.0  PLT 266    Cardiac Enzymes: No results for input(s): CKTOTAL, CKMB, CKMBINDEX, TROPONINI in the last 168 hours.  BNP: Invalid input(s): POCBNP  CBG: Recent Labs  Lab 06/16/18 1624 06/16/18 2117 06/17/18 0758 06/17/18 0930 06/17/18 1149  GLUCAP 179* 81 69* 91 161*    Microbiology: No results found for  this or any previous visit.  Coagulation Studies: No results for input(s): LABPROT, INR in the last 72 hours.  Urinalysis: Recent Labs    06/15/18 0110 06/16/18 0945  COLORURINE STRAW* STRAW*  LABSPEC 1.012 1.011  PHURINE 5.0 5.0  GLUCOSEU 50* 50*  HGBUR LARGE* MODERATE*  BILIRUBINUR NEGATIVE NEGATIVE  KETONESUR 5* NEGATIVE  PROTEINUR >=300* 100*  NITRITE NEGATIVE NEGATIVE  LEUKOCYTESUR NEGATIVE NEGATIVE      Imaging: No results found.   Medications:    . amLODipine  10 mg Oral Daily  . furosemide  40 mg Oral Daily  . insulin aspart  0-9 Units Subcutaneous TID WC  . latanoprost  1 drop Left Eye QHS  . losartan  50 mg Oral Daily  . prednisoLONE acetate  1 drop Left Eye TID AC & HS  . sodium chloride flush  3 mL Intravenous Q12H   acetaminophen **OR** acetaminophen, albuterol, hydrALAZINE, ondansetron **OR** ondansetron (ZOFRAN) IV  Assessment/ Plan:  Keith Maynard is a 27 yo M w/ a PMHx notable for HTN, insulin dependent DM type II, and mild intermittent asthma who presented with concern for lower extremity swelling x 1.5 months with associated polyuria and polydipsia. He was noted to have an increased BUN, Scr, K+ and Na+ on admission. Baseline Scr appears to be near 1.7.    CKD III - Appears to most likely be  2/2 to diabetes given his poorly controlled diabetes (A1c 3 months prior 8.3%), retinal neuropathy, and age. However, renal biopsy is indicated for confirmatory determination. Complement factors ordered-C3, C4 and protein electrophoresis.   Will assess the renal biopsy  Otherwise stable  Will follow  Continue to avoid nephrotoxins   ANEMIA-Normocytic-Hgb 8.6, most likely 2/2 CKD. Ferritin-204, Iron-65, TIBC 237 w/ last Hgb 8.6.  Start darbepoietin  HTN/VOL-Hypertensive. Positive a net 1.3L with improved diuresis with furosemide 40mg  daily. Agree with diuresis. Monitor BMP's daily   Continues to diurese   MICROHEMATURIA-UA w/ moderate Hgb, 24 hour protein with  6.6g/day.         LOS: 2  Lanelle BalLawrence Harbrecht, MD Internal Medicine PGY-2

## 2018-06-17 NOTE — Progress Notes (Signed)
PROGRESS NOTE    Keith Maynard  ZOX:[id number] DOB: [birth year] DOA: 06/14/2018 PCP: Loletta Specter, PA-C    Brief Narrative: Keith Grudzien Meadowsis a 27 y.o.malewith medical history significant ofHTN, DM type II, and mild intermittent asthma;who presented withcomplaints oflower extremity swelling over the last 1-1/21months, associated with polyuria, increased frequency of urination. Labs were abnormal with elevated creatinine and BUN, elevated potassium and sodium. He was admitted for evaluation of AKI with metabolic acidosis.  UA significant for proteinuria, large hemoglobin, and glucose, without any signs of infection, no casts. His FeNa IS 1.9, suspicion of intrinsic renal failure . His albumin is 2.9, total protein is 5.9.     Assessment & Plan:   Principal Problem:   ARF (acute renal failure) (HCC) Active Problems:   Asthma   Metabolic acidosis   Hypernatremia   Bilateral lower extremity edema   Normocytic normochromic anemia   Essential hypertension   Hyperkalemia   Hypocalcemia   AKI with metabolic acidosis. US RENAL ruled out hydronephrosis.  Rule out nephropathy. Differential include diabetic nephropathy.  Appreciate nephrology consult and recommendations.  ANA, complements SPEP pending.  24hour urine protein is 6560.  Repeat bmp  Shows a creatinine persistent at 2.3.  Plan for renal biopsy.  Pt remains asymptomatic.    Hyperkalemia:  Resolved.     Hypernatremia:  Resolved.   Anemia of chronic disease  Hemoglobin around 8.6 anemia panel shows normal iron levels and ferritin levels.  Transfuse to keep hemoglobin greater than 7.  No signs of bleeding. Stool for occult blood is negative.    Diabetes mellitus: CBG (last 3)  Recent Labs    06/16/18 2117 06/17/18 0758 06/17/18 0930  GLUCAP 81 69* 91   Resume SSI. A1c is 5.4 , stop amaryl on discharge.     Asthma:  No wheezing heard.    Hypertension:  Sub optimal.  On Norvasc,  lasix, and losartan. Will add hydralazine 25 mg TID.     DVT prophylaxis: scd's Code Status: FULL CODE.  Family Communication: NONE AT BEDSIDE.  Disposition Plan: pending further evaluation.   Consultants:   Nephrology Dr Lowell Guitar    Procedures: Renal biopsy to be scheduled.    Antimicrobials: none   Subjective: No nausea, vomiting or abdominal pain.  No chest pain or sob.   Objective: Vitals:   06/16/18 1626 06/16/18 2115 06/17/18 0432 06/17/18 0800  BP: (!) 151/88 (!) 156/92 137/85 (!) 160/93  Pulse: (!) 101 (!) 101 94 93  Resp: 16 18 16 18   Temp: 98.7 F (37.1 C) 98.7 F (37.1 C) 98.6 F (37 C) 98.6 F (37 C)  TempSrc: Oral Oral Oral Oral  SpO2: 100% 100% 100% 100%  Weight:        Intake/Output Summary (Last 24 hours) at 06/17/2018 1020 Last data filed at 06/17/2018 0600 Gross per 24 hour  Intake 6 ml  Output 1675 ml  Net -1669 ml   Filed Weights   06/15/18 0815  Weight: 62.6 kg (138 lb 0.1 oz)    Examination:  General exam: Appears calm and comfortable not in any kind of distress Respiratory system: Clear to auscultation. Respiratory effort normal. No wheezing or rhonchi.  Cardiovascular system: S1 & S2 heard, RRR. No JVD, murmurs,  Gastrointestinal system: Abdomen is soft NT ND BS+ Central nervous system: Alert and oriented. No focal neurological deficits. Extremities: Symmetric 5 x 5 power. Trace pedal edema.  Skin: No rashes, lesions or ulcers Psychiatry: . Mood & affect appropriate.  Data Reviewed: I have personally reviewed following labs and imaging studies  CBC: Recent Labs  Lab 06/15/18 0145  WBC 5.8  NEUTROABS 3.4  HGB 8.6*  HCT 28.0*  MCV 93.0  PLT 266   Basic Metabolic Panel: Recent Labs  Lab 06/15/18 0122 06/15/18 0845 06/16/18 0623 06/17/18 0746  NA 149* 142 140 141  K 5.2* 5.2* 4.0 4.1  CL 104 113* 112* 112*  CO2 15* 21* 21* 23  GLUCOSE 166* 183* 82 76  BUN 38* 40* 30* 29*  CREATININE 2.69* 2.72* 2.23* 2.38*    CALCIUM 6.8* 8.5* 8.0* 8.3*   GFR: Estimated Creatinine Clearance: 41.6 mL/min (A) (by C-G formula based on SCr of 2.38 mg/dL (H)). Liver Function Tests: Recent Labs  Lab 06/15/18 0122  AST 15  ALT 18  ALKPHOS 71  BILITOT 0.6  PROT 5.9*  ALBUMIN 2.9*   No results for input(s): LIPASE, AMYLASE in the last 168 hours. No results for input(s): AMMONIA in the last 168 hours. Coagulation Profile: No results for input(s): INR, PROTIME in the last 168 hours. Cardiac Enzymes: No results for input(s): CKTOTAL, CKMB, CKMBINDEX, TROPONINI in the last 168 hours. BNP (last 3 results) No results for input(s): PROBNP in the last 8760 hours. HbA1C: Recent Labs    06/15/18 0701  HGBA1C 5.4   CBG: Recent Labs  Lab 06/16/18 1210 06/16/18 1624 06/16/18 2117 06/17/18 0758 06/17/18 0930  GLUCAP 108* 179* 81 69* 91   Lipid Profile: No results for input(s): CHOL, HDL, LDLCALC, TRIG, CHOLHDL, LDLDIRECT in the last 72 hours. Thyroid Function Tests: Recent Labs    06/15/18 0702  TSH 1.738   Anemia Panel: Recent Labs    06/16/18 0623  FERRITIN 204  TIBC 237*  IRON 65   Sepsis Labs: No results for input(s): PROCALCITON, LATICACIDVEN in the last 168 hours.  No results found for this or any previous visit (from the past 240 hour(s)).       Radiology Studies: No results found.      Scheduled Meds: . amLODipine  10 mg Oral Daily  . furosemide  40 mg Oral Daily  . insulin aspart  0-9 Units Subcutaneous TID WC  . latanoprost  1 drop Left Eye QHS  . losartan  50 mg Oral Daily  . prednisoLONE acetate  1 drop Left Eye TID AC & HS  . sodium chloride flush  3 mL Intravenous Q12H   Continuous Infusions:    LOS: 2 days    Time spent: 35 minutes.    Kathlen ModyVijaya Edeline Greening, MD [facility] Pager [phone]  If 7PM-7AM, please contact night-coverage [email or web address] Password [email or web address] 06/17/2018, 10:20 AM

## 2018-06-18 ENCOUNTER — Inpatient Hospital Stay (HOSPITAL_COMMUNITY): Payer: PRIVATE HEALTH INSURANCE

## 2018-06-18 LAB — PROTIME-INR
INR: 1.09
PROTHROMBIN TIME: 14 s (ref 11.4–15.2)

## 2018-06-18 LAB — BASIC METABOLIC PANEL
ANION GAP: 5 (ref 5–15)
BUN: 30 mg/dL — ABNORMAL HIGH (ref 6–20)
CHLORIDE: 113 mmol/L — AB (ref 98–111)
CO2: 23 mmol/L (ref 22–32)
Calcium: 8.1 mg/dL — ABNORMAL LOW (ref 8.9–10.3)
Creatinine, Ser: 2.43 mg/dL — ABNORMAL HIGH (ref 0.61–1.24)
GFR calc Af Amer: 41 mL/min — ABNORMAL LOW (ref >60)
GFR, EST NON AFRICAN AMERICAN: 35 mL/min — AB (ref >60)
GLUCOSE: 115 mg/dL — AB (ref 70–99)
POTASSIUM: 4.5 mmol/L (ref 3.5–5.1)
Sodium: 141 mmol/L (ref 135–145)

## 2018-06-18 LAB — GLUCOSE, CAPILLARY
GLUCOSE-CAPILLARY: 194 mg/dL — AB (ref 70–99)
Glucose-Capillary: 92 mg/dL (ref 70–99)
Glucose-Capillary: 94 mg/dL (ref 70–99)
Glucose-Capillary: 157 mg/dL — ABNORMAL HIGH (ref 70–99)

## 2018-06-18 MED ORDER — FUROSEMIDE 40 MG PO TABS: 40.0000 mg | ORAL_TABLET | Freq: Every day | ORAL | 0 refills | Status: DC

## 2018-06-18 MED ORDER — FENTANYL CITRATE (PF) 100 MCG/2ML IJ SOLN
INTRAMUSCULAR | Status: AC
  Filled 2018-06-18: qty 2

## 2018-06-18 MED ORDER — TRAMADOL HCL 50 MG PO TABS
50.0000 mg | ORAL_TABLET | Freq: Once | ORAL | Status: AC
  Administered 2018-06-18: 50 mg via ORAL
  Filled 2018-06-18: qty 1

## 2018-06-18 MED ORDER — GLIMEPIRIDE 4 MG PO TABS: 2.0000 mg | ORAL_TABLET | Freq: Every day | ORAL | 0 refills | Status: DC

## 2018-06-18 MED ORDER — MIDAZOLAM HCL 2 MG/2ML IJ SOLN
INTRAMUSCULAR | Status: AC | PRN
  Administered 2018-06-18 (×2): 1 mg via INTRAVENOUS

## 2018-06-18 MED ORDER — LIDOCAINE HCL (PF) 1 % IJ SOLN
INTRAMUSCULAR | Status: AC
  Filled 2018-06-18: qty 30

## 2018-06-18 MED ORDER — MIDAZOLAM HCL 2 MG/2ML IJ SOLN
INTRAMUSCULAR | Status: AC
Start: 2018-06-18 — End: 2018-06-18
  Filled 2018-06-18: qty 2

## 2018-06-18 MED ORDER — FENTANYL CITRATE (PF) 100 MCG/2ML IJ SOLN
INTRAMUSCULAR | Status: AC | PRN
  Administered 2018-06-18: 50 ug via INTRAVENOUS
  Administered 2018-06-18: 25 ug via INTRAVENOUS

## 2018-06-18 MED ORDER — GELATIN ABSORBABLE 12-7 MM EX MISC
CUTANEOUS | Status: AC
  Filled 2018-06-18: qty 1

## 2018-06-18 NOTE — Progress Notes (Signed)
Inpatient Diabetes Program Recommendations  AACE/ADA: New Consensus Statement on Inpatient Glycemic Control (2015)  Target Ranges:  Prepandial:   less than 140 mg/dL      Peak postprandial:   less than 180 mg/dL (1-2 hours)      Critically ill patients:  140 - 180 mg/dL   Lab Results  Component Value Date   GLUCAP 94 06/18/2018   HGBA1C 5.4 06/15/2018    Review of Glycemic Control Results for Keith Maynard, Keith Maynard (MRN [id number]) as of 06/18/2018 12:08  Ref. Range 06/17/2018 11:49 06/17/2018 16:42 06/17/2018 21:10 06/18/2018 00:18 06/18/2018 07:38 06/18/2018 11:38  Glucose-Capillary Latest Ref Range: 70 - 99 mg/dL 409161 (H) 811129 (H) 914272 (H) 194 (H) 92 94    Diabetes history: Type 2 DM  Outpatient Diabetes medications: Amaryl 4 mg daily Current orders for Inpatient glycemic control:  Novolog sensitive tid with meals  Inpatient Diabetes Program Recommendations:   Blood sugars currently well controlled.  No recommendations.   Thanks, Beryl MeagerJenny Nadav Swindell, RN, BC-ADM Inpatient Diabetes Coordinator Pager [phone] (8a-5p)

## 2018-06-18 NOTE — Progress Notes (Signed)
Keith BakerAnthony W Maynard to be D/C'd Home per MD order.  Discussed prescriptions and follow up appointments with the patient. Prescriptions given to patient, medication list explained in detail. Pt verbalized understanding.  Allergies as of 06/18/2018      Reactions   Other Shortness Of Breath   All nuts except peanuts-itching,nausea      Medication List    TAKE these medications   albuterol 108 (90 Base) MCG/ACT inhaler Commonly known as:  PROVENTIL HFA;VENTOLIN HFA Inhale 2 puffs into the lungs every 4 (four) hours as needed for wheezing or shortness of breath.   amLODipine 10 MG tablet Commonly known as:  NORVASC Take 1 tablet (10 mg total) by mouth daily.   furosemide 40 MG tablet Commonly known as:  LASIX Take 1 tablet (40 mg total) by mouth daily. Start taking on:  06/19/2018   glimepiride 4 MG tablet Commonly known as:  AMARYL Take 0.5 tablets (2 mg total) by mouth daily before breakfast. What changed:    how much to take  Another medication with the same name was removed. Continue taking this medication, and follow the directions you see here.   latanoprost 0.005 % ophthalmic solution Commonly known as:  XALATAN Place 1 drop into the left eye at bedtime.   losartan 50 MG tablet Commonly known as:  COZAAR Take 1 tablet (50 mg total) by mouth daily.   prednisoLONE acetate 1 % ophthalmic suspension Commonly known as:  PRED FORTE Place 1 drop into the left eye 4 (four) times daily.            Durable Medical Equipment  (From admission, onward)        Start     Ordered   06/18/18 1606  DME Glucometer  Once     06/18/18 1605      Vitals:   06/18/18 1102 06/18/18 1135  BP: (!) 143/91 (!) 141/97  Pulse: 89 94  Resp: 17 17  Temp: 98.2 F (36.8 C) 98.2 F (36.8 C)  SpO2: 100% 100%    Skin clean, dry and intact without evidence of skin break down, no evidence of skin tears noted. IV catheter discontinued intact. Site without signs and symptoms of  complications. Dressing and pressure applied. Pt denies pain at this time. No complaints noted.  An After Visit Summary was printed and given to the patient. Patient escorted via WC, and D/C home via private auto.  Britt BologneseAnisha Mabe RN, BSN

## 2018-06-18 NOTE — Progress Notes (Signed)
Patient returned from IR following renal biopsy. Vital signs stable and dressing of biopsy clean, dry, intact with no drainage. Patient reports pain 2/10 at site. No further complaints. Will continue to monitor.

## 2018-06-18 NOTE — Progress Notes (Addendum)
[facility] ROUNDING NOTE   Subjective:   Interval History: has no complaints today. Agrees with biopsy.  Objective:  Vital signs in last 24 hours:  Temp:  [97.9 F (36.6 C)-98.5 F (36.9 C)] 98.2 F (36.8 C) (07/23 1135) Pulse Rate:  [89-101] 94 (07/23 1135) Resp:  [14-18] 17 (07/23 1135) BP: (118-153)/(73-103) 141/97 (07/23 1135) SpO2:  [97 %-100 %] 100 % (07/23 1135) Weight:  [137 lb 12.6 oz (62.5 kg)] 137 lb 12.6 oz (62.5 kg) (07/23 1102)  Weight change:  Filed Weights   06/15/18 0815 06/18/18 0534 06/18/18 1102  Weight: 138 lb 0.1 oz (62.6 kg) 137 lb 12.6 oz (62.5 kg) 137 lb 12.6 oz (62.5 kg)    Intake/Output: I/O last 3 completed shifts: In: 486 [P.O.:480; I.V.:6] Out: 500 [Urine:500]   Intake/Output this shift:  Total I/O In: 243 [P.O.:240; I.V.:3] Out: -   CVS- RRR RS- CTA ABD- BS present soft non-distended EXT- no edema   Basic Metabolic Panel: Recent Labs  Lab 06/15/18 0122 06/15/18 0845 06/16/18 0623 06/17/18 0746 06/18/18 1036  NA 149* 142 140 141 141  K 5.2* 5.2* 4.0 4.1 4.5  CL 104 113* 112* 112* 113*  CO2 15* 21* 21* 23 23  GLUCOSE 166* 183* 82 76 115*  BUN 38* 40* 30* 29* 30*  CREATININE 2.69* 2.72* 2.23* 2.38* 2.43*  CALCIUM 6.8* 8.5* 8.0* 8.3* 8.1*    Liver Function Tests: Recent Labs  Lab 06/15/18 0122  AST 15  ALT 18  ALKPHOS 71  BILITOT 0.6  PROT 5.9*  ALBUMIN 2.9*   No results for input(s): LIPASE, AMYLASE in the last 168 hours. No results for input(s): AMMONIA in the last 168 hours.  CBC: Recent Labs  Lab 06/15/18 0145  WBC 5.8  NEUTROABS 3.4  HGB 8.6*  HCT 28.0*  MCV 93.0  PLT 266    Cardiac Enzymes: No results for input(s): CKTOTAL, CKMB, CKMBINDEX, TROPONINI in the last 168 hours.  BNP: Invalid input(s): POCBNP  CBG: Recent Labs  Lab 06/17/18 1642 06/17/18 2110 06/18/18 0018 06/18/18 0738 06/18/18 1138  GLUCAP 129* 272* 194* 92 94    Microbiology: No results found for this  or any previous visit.  Coagulation Studies: Recent Labs    06/18/18 0553  LABPROT 14.0  INR 1.09    Urinalysis: Recent Labs    06/16/18 0945  COLORURINE STRAW*  LABSPEC 1.011  PHURINE 5.0  GLUCOSEU 50*  HGBUR MODERATE*  BILIRUBINUR NEGATIVE  KETONESUR NEGATIVE  PROTEINUR 100*  NITRITE NEGATIVE  LEUKOCYTESUR NEGATIVE      Imaging: [place] Biopsy (kidney)  Result Date: 06/18/2018 INDICATION: 27 year old male with proteinuria and renal dysfunction. He presents for ultrasound-guided random renal biopsy. EXAM: ULTRASOUND GUIDED RENAL BIOPSY COMPARISON:  None. MEDICATIONS: Fentanyl 75 mcg IV; Versed 2 mg IV ANESTHESIA/SEDATION: Total Moderate Sedation time Fifteen minutes COMPLICATIONS: None immediate PROCEDURE: Informed written consent was obtained from the patient after a discussion of the risks, benefits and alternatives to treatment. The patient understands and consents the procedure. A timeout was performed prior to the initiation of the procedure. Ultrasound scanning was performed of the bilateral flanks. The inferior pole of the left kidney was selected for biopsy due to location and sonographic window. The procedure was planned. The operative site was prepped and draped in the usual sterile fashion. The overlying soft tissues were anesthetized with 1% lidocaine with epinephrine. A 16 gauge introducer needle was advanced into the lower pole renal cortex. Two 17 gauge core needle biopsies  were then coaxially obtained under direct ultrasound guidance. Images were saved for documentation purposes. The biopsy device was removed. As the introducer needle was removed, the biopsy tract was embolized with a Gel-Foam slurry. Post procedural scanning was negative for significant post procedural hemorrhage or additional complication. A dressing was placed. The patient tolerated the procedure well without immediate post procedural complication. IMPRESSION: Technically successful ultrasound guided left  renal biopsy. Electronically Signed   By: Malachy Moan M.D.   On: 06/18/2018 09:22   Medications:    . amLODipine  10 mg Oral Daily  . furosemide  40 mg Oral Daily  . insulin aspart  0-9 Units Subcutaneous TID WC  . latanoprost  1 drop Left Eye QHS  . losartan  50 mg Oral Daily  . prednisoLONE acetate  1 drop Left Eye TID AC & HS  . sodium chloride flush  3 mL Intravenous Q12H   acetaminophen **OR** acetaminophen, albuterol, hydrALAZINE, ondansetron **OR** ondansetron (ZOFRAN) IV  Assessment/ Plan:   Assessment/ Plan:  Keith Maynard is a 27 yo M w/ a PMHx notable for HTN, insulin dependent DM type II, and mild intermittent asthma who presented with concern for lower extremity swelling x 1.5 months with associated polyuria and polydipsia. He was noted to have an increased BUN, Scr, K+ and Na+ on admission. Baseline Scr appears to be near 1.7.    CKD III - Appears to most likely be 2/2 to diabetes given his poorly controlled diabetes (A1c 3 months prior 8.3%), retinal neuropathy, and age. However, renal biopsy is indicated for confirmatory determination and is being completed today. Complement factors ordered-C3, C4 and protein electrophoresis. Will sign off today and follow-up on the renal biopsy.  ANEMIA-Normocytic-Hgb 8.6, most likely 2/2 CKD. Ferritin-204, Iron-65, TIBC 237 w/ last Hgb 8.6.   HTN/VOL-Hypertensive. Positive a net 1.3L with improved diuresis with furosemide 40mg  daily.   MICROHEMATURIA-UA w/ moderate Hgb, 24 hour protein with 6.6g/day.   Patient s/p successful renal biopsy  No complications  Would discharge home and can follow up with [facility]  Continue ARB and diuretic    LOS: 3 Lanelle Bal, MD Internal Medicine PGY-2

## 2018-06-18 NOTE — Procedures (Signed)
Interventional Radiology Procedure Note  Procedure: US guided random renal biopsy, LEFT  Complications: None  Estimated Blood Loss: None  Recommendations: - Path pending - Bedrest x 4 hrs  Signed,  Sterling BigHeath K. McCullough, MD

## 2018-06-23 NOTE — Discharge Summary (Addendum)
Physician Discharge Summary  Keith Maynard VWU:[id number] DOB: 27-Feb-1991 DOA: 06/14/2018  PCP: Keith SpecterGomez, Roger David, Maynard  Admit date: 06/14/2018 Discharge date: 06/18/2018  Admitted From: Home.  Disposition:  Home.   Recommendations for Outpatient Follow-up:  1. Follow up with PCP in 1-2 weeks 2. Please obtain BMP/CBC in one week Please follow up with nephrology regarding the renal biopsy.   Discharge Condition: stable.  CODE STATUS:full code.  Diet recommendation: Heart Healthy   Brief/Interim Summary:  Keith Amisnthony W Meadowsis a 26 y.o.malewith medical history significant ofHTN, DM type II, and mild intermittent asthma;who presented withcomplaints oflower extremity swelling over the last 1-1/592months, associated with polyuria, increased frequency of urination. Labs were abnormal with elevated creatinine and BUN, elevated potassium and sodium. He was admitted for evaluation of AKI with metabolic acidosis.  UA significant for proteinuria, large hemoglobin, and glucose, without any signs of infection, no casts. His FeNa IS 1.9, suspicion of intrinsic renal failure . His albumin is 2.9, total protein is 5.9.   He underwent renal biopsy and discharged today.  Recommended to follow up with nephrology in one week.     Discharge Diagnoses:  Principal Problem:   ARF (acute renal failure) (HCC) Active Problems:   Asthma   Metabolic acidosis   Hypernatremia   Bilateral lower extremity edema   Normocytic normochromic anemia   Essential hypertension   Hyperkalemia   Hypocalcemia   AKI (acute kidney injury) (HCC)   Microhematuria   Nephrotic range proteinuria  AKI with metabolic acidosis. US RENAL ruled out hydronephrosis.  Rule out nephropathy. Differential include diabetic nephropathy.  Appreciate nephrology consult and recommendations.  ANA, complements SPEP pending.  24hour urine protein is 6560.  Repeat bmp  Shows a creatinine persistent at 2.3.  Pt underwent renal  biopsy and results are pending.  He was discharged home to follow up with nephrology regarding the biopsy results.    Hyperkalemia:  Resolved.     Hypernatremia:  Resolved.   Anemia of chronic disease  Hemoglobin around 8.6 anemia panel shows normal iron levels and ferritin levels.  Transfuse to keep hemoglobin greater than 7.  No signs of bleeding. Stool for occult blood is negative.    Diabetes mellitus: Decreased the dose of amaryl and stop the metformin on discharge.     Asthma:  No wheezing heard.    Hypertension:  Sub optimal.  On Norvasc, lasix, and losartan.       Discharge Instructions  Discharge Instructions    Diet - low sodium heart healthy   Complete by:  As directed    Discharge instructions   Complete by:  As directed    Follow up with Keith Maynard,  nephrology in one week.  Please follow up the results of the biopsy .  Please stop the metformin, and decrease the dose of the amaryl to 2 mg daily.     Allergies as of 06/18/2018      Reactions   Other Shortness Of Breath   All nuts except peanuts-itching,nausea      Medication List    TAKE these medications   albuterol 108 (90 Base) MCG/ACT inhaler Commonly known as:  PROVENTIL HFA;VENTOLIN HFA Inhale 2 puffs into the lungs every 4 (four) hours as needed for wheezing or shortness of breath.   amLODipine 10 MG tablet Commonly known as:  NORVASC Take 1 tablet (10 mg total) by mouth daily.   furosemide 40 MG tablet Commonly known as:  LASIX Take 1 tablet (40 mg total)  by mouth daily.   glimepiride 4 MG tablet Commonly known as:  AMARYL Take 0.5 tablets (2 mg total) by mouth daily before breakfast. What changed:    how much to take  Another medication with the same name was removed. Continue taking this medication, and follow the directions you see here.   latanoprost 0.005 % ophthalmic solution Commonly known as:  XALATAN Place 1 drop into the left eye at bedtime.    losartan 50 MG tablet Commonly known as:  COZAAR Take 1 tablet (50 mg total) by mouth daily.   prednisoLONE acetate 1 % ophthalmic suspension Commonly known as:  PRED FORTE Place 1 drop into the left eye 4 (four) times daily.      Follow-up Information    Keith Maynard. Schedule an appointment as soon as possible for a visit in 1 week(s).   Specialty:  Physician Assistant Contact information: [place] [address] [phone]        [facility] [place] .   Contact information: [address] [place] [address] [phone]        Keith Maynard. Schedule an appointment as soon as possible for a visit in 1 week(s).   Specialty:  Nephrology Contact information: [address] [place] [address] [phone]          Allergies  Allergen Reactions  . Other Shortness Of Breath    All nuts except peanuts-itching,nausea    Consultations:  Nephrology.    Procedures/Studies: Dg Chest 2 View  Result Date: 06/15/2018 CLINICAL DATA:  Initial evaluation for bilateral leg edema for 1 month, shortness of breath. EXAM: CHEST - 2 VIEW COMPARISON:  Prior radiograph from 01/04/16 FINDINGS: The cardiac and mediastinal silhouettes are stable in size and contour, and remain within normal limits. The lungs are normally inflated. No airspace consolidation, pleural effusion, or pulmonary edema is identified. There is no pneumothorax. No acute osseous abnormality identified. IMPRESSION: No active cardiopulmonary disease. Electronically Signed   By: Rise Mu M.D.   On: 06/15/2018 01:52   US Renal  Result Date: 06/15/2018 CLINICAL DATA:  Initial evaluation for acute renal failure. EXAM: RENAL / URINARY TRACT ULTRASOUND COMPLETE COMPARISON:  None available. FINDINGS: Right Kidney: Length: 10.5 cm. Increased echogenicity within the renal parenchyma, consistent with medical renal disease. No mass or hydronephrosis visualized. Left Kidney: Length: 11.6  cm. Increased echogenicity within the renal parenchyma, compatible with medical renal disease. No mass or hydronephrosis visualized. Bladder: Appears normal for degree of bladder distention. IMPRESSION: 1. Increased echogenicity within the renal parenchyma, compatible with medical renal disease. 2. No hydronephrosis. Electronically Signed   By: Rise Mu M.D.   On: 06/15/2018 06:40   US Biopsy (kidney)  Result Date: 06/18/2018 INDICATION: 27 year old male with proteinuria and renal dysfunction. He presents for ultrasound-guided random renal biopsy. EXAM: ULTRASOUND GUIDED RENAL BIOPSY COMPARISON:  None. MEDICATIONS: Fentanyl 75 mcg IV; Versed 2 mg IV ANESTHESIA/SEDATION: Total Moderate Sedation time Fifteen minutes COMPLICATIONS: None immediate PROCEDURE: Informed written consent was obtained from the patient after a discussion of the risks, benefits and alternatives to treatment. The patient understands and consents the procedure. A timeout was performed prior to the initiation of the procedure. Ultrasound scanning was performed of the bilateral flanks. The inferior pole of the left kidney was selected for biopsy due to location and sonographic window. The procedure was planned. The operative site was prepped and draped in the usual sterile fashion. The overlying soft tissues were anesthetized with 1%  lidocaine with epinephrine. A 16 gauge introducer needle was advanced into the lower pole renal cortex. Two 17 gauge core needle biopsies were then coaxially obtained under direct ultrasound guidance. Images were saved for documentation purposes. The biopsy device was removed. As the introducer needle was removed, the biopsy tract was embolized with a Gel-Foam slurry. Post procedural scanning was negative for significant post procedural hemorrhage or additional complication. A dressing was placed. The patient tolerated the procedure well without immediate post procedural complication. IMPRESSION:  Technically successful ultrasound guided left renal biopsy. Electronically Signed   By: Malachy Moan M.D.   On: 06/18/2018 09:22       Subjective: No chest pain or sob. No nausea or vomiting.   Discharge Exam: Vitals:   06/18/18 1102 06/18/18 1135  BP: (!) 143/91 (!) 141/97  Pulse: 89 94  Resp: 17 17  Temp: 98.2 F (36.8 C) 98.2 F (36.8 C)  SpO2: 100% 100%   Vitals:   06/18/18 1030 06/18/18 1045 06/18/18 1102 06/18/18 1135  BP: (!) 149/98 (!) 143/91 (!) 143/91 (!) 141/97  Pulse: 89 89 89 94  Resp: 16 17 17 17   Temp: 98.2 F (36.8 C) 98.2 F (36.8 C) 98.2 F (36.8 C) 98.2 F (36.8 C)  TempSrc: Oral Oral Oral Oral  SpO2: 100% 100% 100% 100%  Weight:   62.5 kg (137 lb 12.6 oz)   Height:   5\' 7"  (1.702 m)     General: Pt is alert, awake, not in acute distress Cardiovascular: RRR, S1/S2 +, no rubs, no gallops Respiratory: CTA bilaterally, no wheezing, no rhonchi Abdominal: Soft, NT, ND, bowel sounds + Extremities: no edema, no cyanosis    The results of significant diagnostics from this hospitalization (including imaging, microbiology, ancillary and laboratory) are listed below for reference.     Microbiology: No results found for this or any previous visit (from the past 240 hour(s)).   Labs: BNP (last 3 results) Recent Labs    06/15/18 0145  BNP 90.0   Basic Metabolic Panel: Recent Labs  Lab 06/17/18 0746 06/18/18 1036  NA 141 141  K 4.1 4.5  CL 112* 113*  CO2 23 23  GLUCOSE 76 115*  BUN 29* 30*  CREATININE 2.38* 2.43*  CALCIUM 8.3* 8.1*   Liver Function Tests: No results for input(s): AST, ALT, ALKPHOS, BILITOT, PROT, ALBUMIN in the last 168 hours. No results for input(s): LIPASE, AMYLASE in the last 168 hours. No results for input(s): AMMONIA in the last 168 hours. CBC: No results for input(s): WBC, NEUTROABS, HGB, HCT, MCV, PLT in the last 168 hours. Cardiac Enzymes: No results for input(s): CKTOTAL, CKMB, CKMBINDEX, TROPONINI in the  last 168 hours. BNP: Invalid input(s): POCBNP CBG: Recent Labs  Lab 06/17/18 2110 06/18/18 0018 06/18/18 0738 06/18/18 1138 06/18/18 1623  GLUCAP 272* 194* 92 94 157*   D-Dimer No results for input(s): DDIMER in the last 72 hours. Hgb A1c No results for input(s): HGBA1C in the last 72 hours. Lipid Profile No results for input(s): CHOL, HDL, LDLCALC, TRIG, CHOLHDL, LDLDIRECT in the last 72 hours. Thyroid function studies No results for input(s): TSH, T4TOTAL, T3FREE, THYROIDAB in the last 72 hours.  Invalid input(s): FREET3 Anemia work up No results for input(s): VITAMINB12, FOLATE, FERRITIN, TIBC, IRON, RETICCTPCT in the last 72 hours. Urinalysis    Component Value Date/Time   COLORURINE STRAW (A) 06/16/2018 0945   APPEARANCEUR CLEAR 06/16/2018 0945   LABSPEC 1.011 06/16/2018 0945   PHURINE 5.0 06/16/2018 0945  GLUCOSEU 50 (A) 06/16/2018 0945   HGBUR MODERATE (A) 06/16/2018 0945   BILIRUBINUR NEGATIVE 06/16/2018 0945   KETONESUR NEGATIVE 06/16/2018 0945   PROTEINUR 100 (A) 06/16/2018 0945   NITRITE NEGATIVE 06/16/2018 0945   LEUKOCYTESUR NEGATIVE 06/16/2018 0945   Sepsis Labs Invalid input(s): PROCALCITONIN,  WBC,  LACTICIDVEN Microbiology No results found for this or any previous visit (from the past 240 hour(s)).   Time coordinating discharge: 34 minutes  SIGNED:   Kathlen Mody, Maynard  [facility] 06/23/2018, 10:52 PM Pager   If 7PM-7AM, please contact night-coverage [email or web address] Password [email or web address]

## 2018-07-03 ENCOUNTER — Encounter (HOSPITAL_COMMUNITY): Payer: Self-pay | Admitting: Nephrology

## 2018-07-11 ENCOUNTER — Other Ambulatory Visit: Payer: Self-pay

## 2018-07-11 ENCOUNTER — Ambulatory Visit (INDEPENDENT_AMBULATORY_CARE_PROVIDER_SITE_OTHER): Payer: PRIVATE HEALTH INSURANCE | Admitting: Physician Assistant

## 2018-07-11 ENCOUNTER — Encounter (INDEPENDENT_AMBULATORY_CARE_PROVIDER_SITE_OTHER): Payer: Self-pay | Admitting: Physician Assistant

## 2018-07-11 VITALS — BP 142/88 | HR 100 | Temp 98.2°F | Ht 67.0 in | Wt 123.4 lb

## 2018-07-11 DIAGNOSIS — Z09 Encounter for follow-up examination after completed treatment for conditions other than malignant neoplasm: Secondary | ICD-10-CM

## 2018-07-11 DIAGNOSIS — E1121 Type 2 diabetes mellitus with diabetic nephropathy: Secondary | ICD-10-CM

## 2018-07-11 DIAGNOSIS — R319 Hematuria, unspecified: Secondary | ICD-10-CM | POA: Diagnosis not present

## 2018-07-11 DIAGNOSIS — R809 Proteinuria, unspecified: Secondary | ICD-10-CM | POA: Diagnosis not present

## 2018-07-11 DIAGNOSIS — I10 Essential (primary) hypertension: Secondary | ICD-10-CM

## 2018-07-11 DIAGNOSIS — R600 Localized edema: Secondary | ICD-10-CM

## 2018-07-11 DIAGNOSIS — N269 Renal sclerosis, unspecified: Secondary | ICD-10-CM | POA: Diagnosis not present

## 2018-07-11 MED ORDER — GLUCOSE BLOOD VI STRP
ORAL_STRIP | 12 refills | Status: DC
Start: 2018-07-11 — End: 2018-07-12

## 2018-07-11 MED ORDER — CARVEDILOL 6.25 MG PO TABS: 6.2500 mg | ORAL_TABLET | Freq: Two times a day (BID) | ORAL | 5 refills | Status: DC

## 2018-07-11 MED ORDER — TRUEPLUS LANCETS 33G MISC
1.0000 | Freq: Two times a day (BID) | 5 refills | Status: DC
Start: 2018-07-11 — End: 2018-07-12

## 2018-07-11 MED ORDER — TRUE METRIX METER DEVI: 1.0000 | Freq: Every day | 0 refills | Status: DC

## 2018-07-11 MED ORDER — GLIMEPIRIDE 2 MG PO TABS: 2.0000 mg | ORAL_TABLET | Freq: Every day | ORAL | 5 refills | Status: DC

## 2018-07-11 NOTE — Patient Instructions (Signed)
Managing Your Hypertension Hypertension is commonly called high blood pressure. This is when the force of your blood pressing against the walls of your arteries is too strong. Arteries are blood vessels that carry blood from your heart throughout your body. Hypertension forces the heart to work harder to pump blood, and may cause the arteries to become narrow or stiff. Having untreated or uncontrolled hypertension can cause heart attack, stroke, kidney disease, and other problems. What are blood pressure readings? A blood pressure reading consists of a higher number over a lower number. Ideally, your blood pressure should be below 120/80. The first ("top") number is called the systolic pressure. It is a measure of the pressure in your arteries as your heart beats. The second ("bottom") number is called the diastolic pressure. It is a measure of the pressure in your arteries as the heart relaxes. What does my blood pressure reading mean? Blood pressure is classified into four stages. Based on your blood pressure reading, your health care provider may use the following stages to determine what type of treatment you need, if any. Systolic pressure and diastolic pressure are measured in a unit called mm Hg. Normal  Systolic pressure: below 120.  Diastolic pressure: below 80. Elevated  Systolic pressure: 120-129.  Diastolic pressure: below 80. Hypertension stage 1  Systolic pressure: 130-139.  Diastolic pressure: 80-89. Hypertension stage 2  Systolic pressure: 140 or above.  Diastolic pressure: 90 or above. What health risks are associated with hypertension? Managing your hypertension is an important responsibility. Uncontrolled hypertension can lead to:  A heart attack.  A stroke.  A weakened blood vessel (aneurysm).  Heart failure.  Kidney damage.  Eye damage.  Metabolic syndrome.  Memory and concentration problems.  What changes can I make to manage my  hypertension? Hypertension can be managed by making lifestyle changes and possibly by taking medicines. Your health care provider will help you make a plan to bring your blood pressure within a normal range. Eating and drinking  Eat a diet that is high in fiber and potassium, and low in salt (sodium), added sugar, and fat. An example eating plan is called the DASH (Dietary Approaches to Stop Hypertension) diet. To eat this way: ? Eat plenty of fresh fruits and vegetables. Try to fill half of your plate at each meal with fruits and vegetables. ? Eat whole grains, such as whole wheat pasta, brown rice, or whole grain bread. Fill about one quarter of your plate with whole grains. ? Eat low-fat diary products. ? Avoid fatty cuts of meat, processed or cured meats, and poultry with skin. Fill about one quarter of your plate with lean proteins such as fish, chicken without skin, beans, eggs, and tofu. ? Avoid premade and processed foods. These tend to be higher in sodium, added sugar, and fat.  Reduce your daily sodium intake. Most people with hypertension should eat less than 1,500 mg of sodium a day.  Limit alcohol intake to no more than 1 drink a day for nonpregnant women and 2 drinks a day for men. One drink equals 12 oz of beer, 5 oz of wine, or 1 oz of hard liquor. Lifestyle  Work with your health care provider to maintain a healthy body weight, or to lose weight. Ask what an ideal weight is for you.  Get at least 30 minutes of exercise that causes your heart to beat faster (aerobic exercise) most days of the week. Activities may include walking, swimming, or biking.  Include exercise   to strengthen your muscles (resistance exercise), such as weight lifting, as part of your weekly exercise routine. Try to do these types of exercises for 30 minutes at least 3 days a week.  Do not use any products that contain nicotine or tobacco, such as cigarettes and e-cigarettes. If you need help quitting, ask  your health care provider.  Control any long-term (chronic) conditions you have, such as high cholesterol or diabetes. Monitoring  Monitor your blood pressure at home as told by your health care provider. Your personal target blood pressure may vary depending on your medical conditions, your age, and other factors.  Have your blood pressure checked regularly, as often as told by your health care provider. Working with your health care provider  Review all the medicines you take with your health care provider because there may be side effects or interactions.  Talk with your health care provider about your diet, exercise habits, and other lifestyle factors that may be contributing to hypertension.  Visit your health care provider regularly. Your health care provider can help you create and adjust your plan for managing hypertension. Will I need medicine to control my blood pressure? Your health care provider may prescribe medicine if lifestyle changes are not enough to get your blood pressure under control, and if:  Your systolic blood pressure is 130 or higher.  Your diastolic blood pressure is 80 or higher.  Take medicines only as told by your health care provider. Follow the directions carefully. Blood pressure medicines must be taken as prescribed. The medicine does not work as well when you skip doses. Skipping doses also puts you at risk for problems. Contact a health care provider if:  You think you are having a reaction to medicines you have taken.  You have repeated (recurrent) headaches.  You feel dizzy.  You have swelling in your ankles.  You have trouble with your vision. Get help right away if:  You develop a severe headache or confusion.  You have unusual weakness or numbness, or you feel faint.  You have severe pain in your chest or abdomen.  You vomit repeatedly.  You have trouble breathing. Summary  Hypertension is when the force of blood pumping through  your arteries is too strong. If this condition is not controlled, it may put you at risk for serious complications.  Your personal target blood pressure may vary depending on your medical conditions, your age, and other factors. For most people, a normal blood pressure is less than 120/80.  Hypertension is managed by lifestyle changes, medicines, or both. Lifestyle changes include weight loss, eating a healthy, low-sodium diet, exercising more, and limiting alcohol. This information is not intended to replace advice given to you by your health care provider. Make sure you discuss any questions you have with your health care provider. Document Released: 08/07/2012 Document Revised: 10/11/2016 Document Reviewed: 10/11/2016 [employer]vier Interactive Patient Education  2018 [employer]  

## 2018-07-11 NOTE — Progress Notes (Signed)
Subjective:  Patient ID: Keith Maynard, male    DOB: 11-Dec-1990  Age: 27 y.o. MRN: [id number]  CC: Hospital f/u   HPI [facility] a 26 y.o.malewith a medical history of asthma, DM2, and HTN presents with need for a glucometer. Last A1c 5.4% nearly one month ago while in hospital for AKI with metabolic acidosis. Had renal biopsy done with findings as follows:  ADVANCED FOCAL AND SEGMENTAL GLOMERULOSCLEROSIS, NOS WITH MARKED GLOBAL GLOMERULOSCLEROSIS AND DIFFUSE MODERATE TO SEVERE TUBULOINTERSTITIAL SCARRING. ULTRASTRUCTURAL EVIDENCE OF EARLY DIABETIC GLOMERULOPATHY. MODERATE ARTERIOSCLEROSIS.  Pt states he is feeling well. Seen by nephrologist at [facility] yesterday and prescribed Cellcept. Does not endorse CP, palpitations, SOB, HA, abdominal pain, back pain, oliguria, hematuria, f/c/n/v, rash, LE edema, or GI sxs.      Outpatient Medications Prior to Visit  Medication Sig Dispense Refill  . amLODipine (NORVASC) 10 MG tablet Take 1 tablet (10 mg total) by mouth daily. 90 tablet 1  . furosemide (LASIX) 40 MG tablet Take 1 tablet (40 mg total) by mouth daily. 30 tablet 0  . latanoprost (XALATAN) 0.005 % ophthalmic solution Place 1 drop into the left eye at bedtime.    Marland Kitchen. losartan (COZAAR) 50 MG tablet Take 1 tablet (50 mg total) by mouth daily. 90 tablet 3  . prednisoLONE acetate (PRED FORTE) 1 % ophthalmic suspension Place 1 drop into the left eye 4 (four) times daily.    Marland Kitchen. albuterol (PROVENTIL HFA;VENTOLIN HFA) 108 (90 Base) MCG/ACT inhaler Inhale 2 puffs into the lungs every 4 (four) hours as needed for wheezing or shortness of breath. 1 Inhaler 5  . CELLCEPT 250 MG capsule Take 250 mg by mouth.    Marland Kitchen. glimepiride (AMARYL) 4 MG tablet Take 0.5 tablets (2 mg total) by mouth daily before breakfast. (Patient not taking: Reported on 07/11/2018) 30 tablet 0   No facility-administered medications prior to visit.      ROS Review of Systems  Constitutional:  Negative for chills, fever and malaise/fatigue.  Eyes: Negative for blurred vision.  Respiratory: Negative for shortness of breath.   Cardiovascular: Negative for chest pain and palpitations.  Gastrointestinal: Negative for abdominal pain and nausea.  Genitourinary: Negative for dysuria and hematuria.  Musculoskeletal: Negative for joint pain and myalgias.  Skin: Negative for rash.  Neurological: Negative for tingling and headaches.  Psychiatric/Behavioral: Negative for depression. The patient is not nervous/anxious.     Objective:  BP (!) 142/88 (BP Location: Right Arm, Patient Position: Sitting, Cuff Size: Normal)   Pulse 100   Temp 98.2 F (36.8 C) (Oral)   Ht 5\' 7"  (1.702 m)   Wt 123 lb 6.4 oz (56 kg)   SpO2 100%   BMI 19.33 kg/m   BP/Weight 07/11/2018 06/18/2018 03/22/2018  Systolic BP 142 141 150  Diastolic BP 88 97 92  Wt. (Lbs) 123.4 137.79 125  BMI 19.33 21.58 19.58      Physical Exam  Constitutional: He is oriented to person, place, and time.  Well developed, thin, NAD, polite  HENT:  Head: Normocephalic and atraumatic.  Eyes: No scleral icterus.  Neck: Normal range of motion. Neck supple. No thyromegaly present.  Cardiovascular: Normal rate, regular rhythm and normal heart sounds.  No LE edema bilaterally  Pulmonary/Chest: Effort normal and breath sounds normal.  Musculoskeletal: He exhibits no edema.  Neurological: He is alert and oriented to person, place, and time.  Skin: Skin is warm and dry. No rash noted. No erythema. No pallor.  Psychiatric: He has a normal mood and affect. His behavior is normal. Thought content normal.  Vitals reviewed.    Assessment & Plan:    1. Hospital discharge follow-up - Basic Metabolic Panel - CBC with Differential - ANA Maynard/Reflex - Sedimentation Rate - C-reactive protein - Lipid panel  2. Glomerulosclerosis - Basic Metabolic Panel - CBC with Differential - ANA Maynard/Reflex - Sedimentation Rate - C-reactive  protein - Lipid panel  3. Proteinuria, unspecified type - Basic Metabolic Panel - CBC with Differential - ANA Maynard/Reflex - Sedimentation Rate - C-reactive protein - Lipid panel  4. Hematuria, unspecified type - Basic Metabolic Panel - CBC with Differential - ANA Maynard/Reflex - Sedimentation Rate - C-reactive protein - Lipid panel  5. Bilateral lower extremity edema - Resolved, no current edema - Basic Metabolic Panel - CBC with Differential - ANA Maynard/Reflex - Sedimentation Rate - C-reactive protein - Lipid panel  6. Hypertension, unspecified type - carvedilol (COREG) 6.25 MG tablet; Take 1 tablet (6.25 mg total) by mouth 2 (two) times daily with a meal.  Dispense: 60 tablet; Refill: 5  7. Type 2 diabetes mellitus with diabetic nephropathy, without long-term current use of insulin (HCC) - glimepiride (AMARYL) 2 MG tablet; Take 1 tablet (2 mg total) by mouth daily before breakfast.  Dispense: 30 tablet; Refill: 5   Meds ordered this encounter  Medications  . glimepiride (AMARYL) 2 MG tablet    Sig: Take 1 tablet (2 mg total) by mouth daily before breakfast.    Dispense:  30 tablet    Refill:  5    Order Specific Question:   Supervising Provider    Answer:   Hoy RegisterNEWLIN, ENOBONG [[id number]]  . carvedilol (COREG) 6.25 MG tablet    Sig: Take 1 tablet (6.25 mg total) by mouth 2 (two) times daily with a meal.    Dispense:  60 tablet    Refill:  5    Order Specific Question:   Supervising Provider    Answer:   Hoy RegisterNEWLIN, ENOBONG [[id number]]  . glucose blood (TRUE METRIX BLOOD GLUCOSE TEST) test strip    Sig: Use twice a day    Dispense:  100 each    Refill:  12    E11.10    Order Specific Question:   Supervising Provider    Answer:   Hoy RegisterNEWLIN, ENOBONG [[id number]]  . Blood Glucose Monitoring Suppl (TRUE METRIX METER) DEVI    Sig: 1 each by Does not apply route daily.    Dispense:  1 Device    Refill:  0    E11.10    Order Specific Question:   Supervising Provider    Answer:   Hoy RegisterNEWLIN, ENOBONG  [[id number]]  . TRUEPLUS LANCETS 33G MISC    Sig: 1 each by Does not apply route 2 (two) times daily.    Dispense:  60 each    Refill:  5    E11.10    Order Specific Question:   Supervising Provider    Answer:   Hoy RegisterNEWLIN, ENOBONG [[id number]]    Follow-up: Return in about 4 weeks (around 08/08/2018) for HTN.   Loletta Specteroger David Gomez PA

## 2018-07-12 ENCOUNTER — Telehealth (INDEPENDENT_AMBULATORY_CARE_PROVIDER_SITE_OTHER): Payer: Self-pay

## 2018-07-12 ENCOUNTER — Other Ambulatory Visit (INDEPENDENT_AMBULATORY_CARE_PROVIDER_SITE_OTHER): Payer: Self-pay | Admitting: Physician Assistant

## 2018-07-12 DIAGNOSIS — I10 Essential (primary) hypertension: Secondary | ICD-10-CM

## 2018-07-12 DIAGNOSIS — E7841 Elevated Lipoprotein(a): Secondary | ICD-10-CM

## 2018-07-12 DIAGNOSIS — E1121 Type 2 diabetes mellitus with diabetic nephropathy: Secondary | ICD-10-CM

## 2018-07-12 LAB — CBC WITH DIFFERENTIAL/PLATELET
BASOS: 1 %
Basophils Absolute: 0.1 10*3/uL (ref 0.0–0.2)
EOS (ABSOLUTE): 0.4 10*3/uL (ref 0.0–0.4)
Eos: 8 %
HEMOGLOBIN: 9.4 g/dL — AB (ref 13.0–17.7)
Hematocrit: 29.5 % — ABNORMAL LOW (ref 37.5–51.0)
IMMATURE GRANS (ABS): 0 10*3/uL (ref 0.0–0.1)
Immature Granulocytes: 0 %
LYMPHS ABS: 1.9 10*3/uL (ref 0.7–3.1)
LYMPHS: 33 %
MCH: 28.1 pg (ref 26.6–33.0)
MCHC: 31.9 g/dL (ref 31.5–35.7)
MCV: 88 fL (ref 79–97)
MONOCYTES: 6 %
Monocytes Absolute: 0.4 10*3/uL (ref 0.1–0.9)
Neutrophils Absolute: 3 10*3/uL (ref 1.4–7.0)
Neutrophils: 52 %
Platelets: 348 10*3/uL (ref 150–450)
RBC: 3.35 x10E6/uL — ABNORMAL LOW (ref 4.14–5.80)
RDW: 15.5 % — ABNORMAL HIGH (ref 12.3–15.4)
WBC: 5.8 10*3/uL (ref 3.4–10.8)

## 2018-07-12 LAB — BASIC METABOLIC PANEL
BUN / CREAT RATIO: 14 (ref 9–20)
BUN: 36 mg/dL — AB (ref 6–20)
CALCIUM: 9.5 mg/dL (ref 8.7–10.2)
CO2: 20 mmol/L (ref 20–29)
Chloride: 108 mmol/L — ABNORMAL HIGH (ref 96–106)
Creatinine, Ser: 2.66 mg/dL — ABNORMAL HIGH (ref 0.76–1.27)
GFR, EST AFRICAN AMERICAN: 37 mL/min/{1.73_m2} — AB (ref >59)
GFR, EST NON AFRICAN AMERICAN: 32 mL/min/{1.73_m2} — AB (ref >59)
Glucose: 149 mg/dL — ABNORMAL HIGH (ref 65–99)
POTASSIUM: 5.7 mmol/L — AB (ref 3.5–5.2)
Sodium: 144 mmol/L (ref 134–144)

## 2018-07-12 LAB — SEDIMENTATION RATE: Sed Rate: 51 mm/hr — ABNORMAL HIGH (ref 0–15)

## 2018-07-12 LAB — LIPID PANEL
CHOLESTEROL TOTAL: 284 mg/dL — AB (ref 100–199)
Chol/HDL Ratio: 3.6 ratio (ref 0.0–5.0)
HDL: 78 mg/dL (ref >39)
LDL Calculated: 182 mg/dL — ABNORMAL HIGH (ref 0–99)
TRIGLYCERIDES: 121 mg/dL (ref 0–149)
VLDL CHOLESTEROL CAL: 24 mg/dL (ref 5–40)

## 2018-07-12 LAB — ANA W/REFLEX: Anti Nuclear Antibody(ANA): NEGATIVE

## 2018-07-12 LAB — C-REACTIVE PROTEIN: CRP: <1 mg/L (ref 0–10)

## 2018-07-12 MED ORDER — GLUCOSE BLOOD VI STRP: ORAL_STRIP | 12 refills | Status: DC

## 2018-07-12 MED ORDER — ATORVASTATIN CALCIUM 40 MG PO TABS: 40.0000 mg | ORAL_TABLET | Freq: Every day | ORAL | 3 refills | Status: DC

## 2018-07-12 MED ORDER — CARVEDILOL 6.25 MG PO TABS: 6.2500 mg | ORAL_TABLET | Freq: Two times a day (BID) | ORAL | 5 refills | Status: DC

## 2018-07-12 MED ORDER — GLIMEPIRIDE 2 MG PO TABS: 2.0000 mg | ORAL_TABLET | Freq: Every day | ORAL | 5 refills | Status: DC

## 2018-07-12 MED ORDER — TRUE METRIX METER DEVI: 1.0000 | Freq: Every day | 0 refills | Status: DC

## 2018-07-12 MED ORDER — TRUEPLUS LANCETS 33G MISC: 1.0000 | Freq: Two times a day (BID) | 5 refills | Status: AC

## 2018-07-12 NOTE — Telephone Encounter (Signed)
Patient verified DOB. Patient aware that he does not have an autoimmune disease. Cholesterol elevated; [facility] [address] Encouraged patient to make sure he keeps his nephrology appointments. He is aware that he needs to have his potassium rechecked in one week. Advised that he can just come into clinic on Thursday 8/22 for lab only visit. Maryjean Mornempestt S Roberts, CMA

## 2018-07-12 NOTE — Telephone Encounter (Signed)
-----   Message from Loletta Specteroger David Gomez, PA-C sent at 07/12/2018 12:45 PM EDT ----- No autoimmune disease. Cholesterol elevated. I have sent [facility]orvastatin to [facility] on [address]. Make sure to keep appointments with nephrologist, will need to have his potassium rechecked in a week.

## 2018-07-25 ENCOUNTER — Inpatient Hospital Stay (INDEPENDENT_AMBULATORY_CARE_PROVIDER_SITE_OTHER): Payer: PRIVATE HEALTH INSURANCE | Admitting: Physician Assistant

## 2018-07-25 ENCOUNTER — Encounter

## 2018-08-01 ENCOUNTER — Other Ambulatory Visit (INDEPENDENT_AMBULATORY_CARE_PROVIDER_SITE_OTHER): Payer: Self-pay | Admitting: Physician Assistant

## 2018-08-01 ENCOUNTER — Encounter (INDEPENDENT_AMBULATORY_CARE_PROVIDER_SITE_OTHER): Payer: Self-pay | Admitting: Physician Assistant

## 2018-08-01 MED ORDER — FUROSEMIDE 40 MG PO TABS: 40.0000 mg | ORAL_TABLET | Freq: Every day | ORAL | 3 refills | Status: DC | PRN

## 2018-08-05 ENCOUNTER — Other Ambulatory Visit (INDEPENDENT_AMBULATORY_CARE_PROVIDER_SITE_OTHER): Payer: Self-pay | Admitting: Physician Assistant

## 2018-08-05 DIAGNOSIS — J4520 Mild intermittent asthma, uncomplicated: Secondary | ICD-10-CM

## 2018-08-06 MED ORDER — ALBUTEROL SULFATE HFA 108 (90 BASE) MCG/ACT IN AERS: 2.0000 | INHALATION_SPRAY | RESPIRATORY_TRACT | 2 refills | Status: DC | PRN

## 2018-08-06 NOTE — Telephone Encounter (Signed)
FWD to PCP. Sandrea Boer S Dimonique Bourdeau, CMA  

## 2018-08-08 ENCOUNTER — Inpatient Hospital Stay (HOSPITAL_COMMUNITY)
Admission: RE | Admit: 2018-08-08 | Discharge: 2018-08-08 | Disposition: A | Discharge status: Home / Self Care | Payer: PRIVATE HEALTH INSURANCE | Source: Ambulatory Visit | Attending: Nephrology | Admitting: Nephrology

## 2018-08-13 ENCOUNTER — Ambulatory Visit (INDEPENDENT_AMBULATORY_CARE_PROVIDER_SITE_OTHER): Payer: PRIVATE HEALTH INSURANCE | Admitting: Physician Assistant

## 2018-08-27 ENCOUNTER — Encounter (INDEPENDENT_AMBULATORY_CARE_PROVIDER_SITE_OTHER): Payer: Self-pay | Admitting: Physician Assistant

## 2018-09-05 ENCOUNTER — Encounter (HOSPITAL_COMMUNITY): Payer: PRIVATE HEALTH INSURANCE

## 2018-09-08 ENCOUNTER — Other Ambulatory Visit (INDEPENDENT_AMBULATORY_CARE_PROVIDER_SITE_OTHER): Payer: Self-pay | Admitting: Physician Assistant

## 2018-09-08 DIAGNOSIS — I10 Essential (primary) hypertension: Secondary | ICD-10-CM

## 2018-09-09 NOTE — Telephone Encounter (Signed)
FWD to PCP. Kayne Yuhas S Abree Romick, CMA  

## 2018-09-15 ENCOUNTER — Other Ambulatory Visit (INDEPENDENT_AMBULATORY_CARE_PROVIDER_SITE_OTHER): Payer: Self-pay | Admitting: Physician Assistant

## 2018-09-15 DIAGNOSIS — J4520 Mild intermittent asthma, uncomplicated: Secondary | ICD-10-CM

## 2018-09-17 MED ORDER — ALBUTEROL SULFATE HFA 108 (90 BASE) MCG/ACT IN AERS: 2.0000 | INHALATION_SPRAY | RESPIRATORY_TRACT | 2 refills | Status: DC | PRN

## 2018-09-17 NOTE — Telephone Encounter (Signed)
FWD to PCP. Kalene Cutler S Tawyna Pellot, CMA  

## 2018-09-24 ENCOUNTER — Encounter (HOSPITAL_COMMUNITY): Payer: PRIVATE HEALTH INSURANCE

## 2018-09-25 ENCOUNTER — Ambulatory Visit (HOSPITAL_COMMUNITY)
Admission: RE | Admit: 2018-09-25 | Discharge: 2018-09-25 | Disposition: A | Discharge status: Home / Self Care | Payer: Medicaid Other | Source: Ambulatory Visit | Attending: Nephrology | Admitting: Nephrology

## 2018-09-25 VITALS — BP 157/83 | HR 91 | Temp 98.1°F | Resp 20

## 2018-09-25 DIAGNOSIS — D631 Anemia in chronic kidney disease: Secondary | ICD-10-CM | POA: Diagnosis present

## 2018-09-25 DIAGNOSIS — N183 Chronic kidney disease, stage 3 (moderate): Secondary | ICD-10-CM | POA: Diagnosis not present

## 2018-09-25 DIAGNOSIS — N179 Acute kidney failure, unspecified: Secondary | ICD-10-CM

## 2018-09-25 LAB — FERRITIN: FERRITIN: 206 ng/mL (ref 24–336)

## 2018-09-25 LAB — POCT HEMOGLOBIN-HEMACUE: Hemoglobin: 8.3 g/dL — ABNORMAL LOW (ref 13.0–17.0)

## 2018-09-25 LAB — IRON AND TIBC
IRON: 75 ug/dL (ref 45–182)
Saturation Ratios: 26 % (ref 17.9–39.5)
TIBC: 284 ug/dL (ref 250–450)
UIBC: 209 ug/dL

## 2018-09-25 MED ORDER — EPOETIN ALFA-EPBX 2000 UNIT/ML IJ SOLN
2000.0000 [IU] | Freq: Once | INTRAMUSCULAR | Status: AC
  Administered 2018-09-25: 2000 [IU] via SUBCUTANEOUS
  Filled 2018-09-25: qty 1

## 2018-09-25 MED ORDER — EPOETIN ALFA-EPBX 10000 UNIT/ML IJ SOLN: 5000.0000 [IU] | INTRAMUSCULAR | Status: DC

## 2018-09-25 MED ORDER — EPOETIN ALFA-EPBX 3000 UNIT/ML IJ SOLN
3000.0000 [IU] | Freq: Once | INTRAMUSCULAR | Status: AC
  Administered 2018-09-25: 3000 [IU] via SUBCUTANEOUS
  Filled 2018-09-25: qty 1

## 2018-09-25 NOTE — Discharge Instructions (Signed)
Epoetin Alfa injection °What is this medicine? °EPOETIN ALFA (e POE e tin AL fa) helps your body make more red blood cells. This medicine is used to treat anemia caused by chronic kidney failure, cancer chemotherapy, or HIV-therapy. It may also be used before surgery if you have anemia. °This medicine may be used for other purposes; ask your health care provider or pharmacist if you have questions. °COMMON BRAND NAME(S): Epogen, Procrit °What should I tell my health care provider before I take this medicine? °They need to know if you have any of these conditions: °-blood clotting disorders °-cancer patient not on chemotherapy °-cystic fibrosis °-heart disease, such as angina or heart failure °-hemoglobin level of 12 g/dL or greater °-high blood pressure °-low levels of folate, iron, or vitamin B12 °-seizures °-an unusual or allergic reaction to erythropoietin, albumin, benzyl alcohol, hamster proteins, other medicines, foods, dyes, or preservatives °-pregnant or trying to get pregnant °-breast-feeding °How should I use this medicine? °This medicine is for injection into a vein or under the skin. It is usually given by a health care professional in a hospital or clinic setting. °If you get this medicine at home, you will be taught how to prepare and give this medicine. Use exactly as directed. Take your medicine at regular intervals. Do not take your medicine more often than directed. °It is important that you put your used needles and syringes in a special sharps container. Do not put them in a trash can. If you do not have a sharps container, call your pharmacist or healthcare provider to get one. °A special MedGuide will be given to you by the pharmacist with each prescription and refill. Be sure to read this information carefully each time. °Talk to your pediatrician regarding the use of this medicine in children. While this drug may be prescribed for selected conditions, precautions do apply. °Overdosage: If you  think you have taken too much of this medicine contact a poison control center or emergency room at once. °NOTE: This medicine is only for you. Do not share this medicine with others. °What if I miss a dose? °If you miss a dose, take it as soon as you can. If it is almost time for your next dose, take only that dose. Do not take double or extra doses. °What may interact with this medicine? °Do not take this medicine with any of the following medications: °-darbepoetin alfa °This list may not describe all possible interactions. Give your health care provider a list of all the medicines, herbs, non-prescription drugs, or dietary supplements you use. Also tell them if you smoke, drink alcohol, or use illegal drugs. Some items may interact with your medicine. °What should I watch for while using this medicine? °Your condition will be monitored carefully while you are receiving this medicine. °You may need blood work done while you are taking this medicine. °What side effects may I notice from receiving this medicine? °Side effects that you should report to your doctor or health care professional as soon as possible: °-allergic reactions like skin rash, itching or hives, swelling of the face, lips, or tongue °-breathing problems °-changes in vision °-chest pain °-confusion, trouble speaking or understanding °-feeling faint or lightheaded, falls °-high blood pressure °-muscle aches or pains °-pain, swelling, warmth in the leg °-rapid weight gain °-severe headaches °-sudden numbness or weakness of the face, arm or leg °-trouble walking, dizziness, loss of balance or coordination °-seizures (convulsions) °-swelling of the ankles, feet, hands °-unusually weak or tired °  Side effects that usually do not require medical attention (report to your doctor or health care professional if they continue or are bothersome): °-diarrhea °-fever, chills (flu-like symptoms) °-headaches °-nausea, vomiting °-redness, stinging, or swelling at  site where injected °This list may not describe all possible side effects. Call your doctor for medical advice about side effects. You may report side effects to FDA at 1-800-FDA-1088. °Where should I keep my medicine? °Keep out of the reach of children. °Store in a refrigerator between 2 and 8 degrees C (36 and 46 degrees F). Do not freeze or shake. Throw away any unused portion if using a single-dose vial. Multi-dose vials can be kept in the refrigerator for up to 21 days after the initial dose. Throw away unused medicine. °NOTE: This sheet is a summary. It may not cover all possible information. If you have questions about this medicine, talk to your doctor, pharmacist, or health care provider. °© 2018 Elsevier/Gold Standard (2016-07-03 19:42:31) ° °

## 2018-10-01 ENCOUNTER — Encounter (INDEPENDENT_AMBULATORY_CARE_PROVIDER_SITE_OTHER): Payer: Self-pay | Admitting: Physician Assistant

## 2018-10-02 ENCOUNTER — Ambulatory Visit (HOSPITAL_COMMUNITY)
Admission: RE | Admit: 2018-10-02 | Discharge: 2018-10-02 | Disposition: A | Discharge status: Home / Self Care | Payer: Medicaid Other | Source: Ambulatory Visit | Attending: Nephrology | Admitting: Nephrology

## 2018-10-02 ENCOUNTER — Encounter (HOSPITAL_COMMUNITY): Payer: PRIVATE HEALTH INSURANCE

## 2018-10-02 VITALS — BP 123/80 | HR 82

## 2018-10-02 DIAGNOSIS — N179 Acute kidney failure, unspecified: Secondary | ICD-10-CM | POA: Diagnosis not present

## 2018-10-02 LAB — POCT HEMOGLOBIN-HEMACUE: Hemoglobin: 8.7 g/dL — ABNORMAL LOW (ref 13.0–17.0)

## 2018-10-02 MED ORDER — EPOETIN ALFA-EPBX 10000 UNIT/ML IJ SOLN: 5000.0000 [IU] | INTRAMUSCULAR | Status: DC

## 2018-10-02 MED ORDER — EPOETIN ALFA-EPBX 2000 UNIT/ML IJ SOLN
2000.0000 [IU] | Freq: Once | INTRAMUSCULAR | Status: AC
  Administered 2018-10-02: 2000 [IU] via SUBCUTANEOUS
  Filled 2018-10-02: qty 1

## 2018-10-02 MED ORDER — EPOETIN ALFA-EPBX 3000 UNIT/ML IJ SOLN
3000.0000 [IU] | Freq: Once | INTRAMUSCULAR | Status: AC
  Administered 2018-10-02: 3000 [IU] via SUBCUTANEOUS
  Filled 2018-10-02: qty 1

## 2018-10-07 ENCOUNTER — Encounter (HOSPITAL_COMMUNITY): Payer: Self-pay

## 2018-10-07 ENCOUNTER — Ambulatory Visit (HOSPITAL_COMMUNITY)
Admission: EM | Admit: 2018-10-07 | Discharge: 2018-10-07 | Disposition: A | Discharge status: Home / Self Care | Payer: Medicaid Other | Attending: Urgent Care | Admitting: Urgent Care

## 2018-10-07 DIAGNOSIS — R112 Nausea with vomiting, unspecified: Secondary | ICD-10-CM | POA: Diagnosis not present

## 2018-10-07 DIAGNOSIS — R630 Anorexia: Secondary | ICD-10-CM

## 2018-10-07 DIAGNOSIS — N183 Chronic kidney disease, stage 3 unspecified: Secondary | ICD-10-CM

## 2018-10-07 DIAGNOSIS — R638 Other symptoms and signs concerning food and fluid intake: Secondary | ICD-10-CM

## 2018-10-07 DIAGNOSIS — D638 Anemia in other chronic diseases classified elsewhere: Secondary | ICD-10-CM

## 2018-10-07 MED ORDER — ONDANSETRON 4 MG PO TBDP
4.0000 mg | ORAL_TABLET | Freq: Once | ORAL | Status: AC
  Administered 2018-10-07: 4 mg via ORAL

## 2018-10-07 MED ORDER — ONDANSETRON 4 MG PO TBDP
ORAL_TABLET | ORAL | Status: AC
  Filled 2018-10-07: qty 1

## 2018-10-07 NOTE — ED Triage Notes (Signed)
Pt presents with vomiting over the past 2 weeks and fatigue.

## 2018-10-07 NOTE — ED Provider Notes (Signed)
MRN: [id number] DOB: 09/04/1991  Subjective:   Keith Maynard is a 27 y.o. male presenting for 2 week history of nausea with vomiting almost daily, has decreased appetite. Has iron deficiency anemia, CKD, Type 2 DM. Patient has PCP, Dr. Lily Kocher, did not have an appointment available to him today.  Denies fever, headache, confusion, dizziness, chest pain, belly pain, hematuria, diarrhea.  Reports that he has not had a bowel movement in 2 days but also has not eaten very much at all.  Reports that his last meal was last night.    No current facility-administered medications for this encounter.   Current Outpatient Medications:  .  albuterol (PROVENTIL HFA;VENTOLIN HFA) 108 (90 Base) MCG/ACT inhaler, Inhale 2 puffs into the lungs every 4 (four) hours as needed for wheezing or shortness of breath., Disp: 1 Inhaler, Rfl: 2 .  amLODipine (NORVASC) 10 MG tablet, TAKE 1 TABLET BY MOUTH EVERY DAY, Disp: 90 tablet, Rfl: 0 .  atorvastatin (LIPITOR) 40 MG tablet, Take 1 tablet (40 mg total) by mouth daily., Disp: 90 tablet, Rfl: 3 .  Blood Glucose Monitoring Suppl (TRUE METRIX METER) DEVI, 1 each by Does not apply route daily., Disp: 1 Device, Rfl: 0 .  carvedilol (COREG) 6.25 MG tablet, Take 1 tablet (6.25 mg total) by mouth 2 (two) times daily with a meal., Disp: 60 tablet, Rfl: 5 .  CELLCEPT 250 MG capsule, Take 250 mg by mouth., Disp: , Rfl:  .  furosemide (LASIX) 40 MG tablet, Take 1 tablet (40 mg total) by mouth daily as needed., Disp: 30 tablet, Rfl: 3 .  glimepiride (AMARYL) 2 MG tablet, Take 1 tablet (2 mg total) by mouth daily before breakfast., Disp: 30 tablet, Rfl: 5 .  glucose blood (TRUE METRIX BLOOD GLUCOSE TEST) test strip, Use twice a day, Disp: 100 each, Rfl: 12 .  latanoprost (XALATAN) 0.005 % ophthalmic solution, Place 1 drop into the left eye at bedtime., Disp: , Rfl:  .  losartan (COZAAR) 50 MG tablet, Take 1 tablet (50 mg total) by mouth daily., Disp: 90 tablet, Rfl: 3 .   prednisoLONE acetate (PRED FORTE) 1 % ophthalmic suspension, Place 1 drop into the left eye 4 (four) times daily., Disp: , Rfl:  .  TRUEPLUS LANCETS 33G MISC, Inject 1 each into the skin 2 (two) times daily., Disp: 60 each, Rfl: 5    Allergies  Allergen Reactions  . Other Shortness Of Breath    All nuts except peanuts-itching,nausea    Past Medical History:  Diagnosis Date  . Asthma   . Diabetes mellitus without complication (HCC)   . Hypertension      Past Surgical History:  Procedure Laterality Date  . EYE SURGERY      Objective:   Vitals: BP 130/73 (BP Location: Left Arm)   Pulse 78   Temp 98.2 F (36.8 C) (Oral)   Resp 18   SpO2 100%   Physical Exam  Constitutional: He is oriented to person, place, and time. He appears well-developed and well-nourished.  HENT:  Mouth/Throat: Oropharynx is clear and moist.  Eyes: Pupils are equal, round, and reactive to light. EOM are normal. No scleral icterus.  Cardiovascular: Normal rate, regular rhythm, normal heart sounds and intact distal pulses. Exam reveals no gallop and no friction rub.  No murmur heard. Pulmonary/Chest: Effort normal and breath sounds normal. No stridor. No respiratory distress. He has no wheezes. He has no rales.  Abdominal: Soft. Bowel sounds are normal.  Neurological: He is  alert and oriented to person, place, and time.  Skin: Skin is warm and dry.   Assessment and Plan :   Nausea and vomiting, intractability of vomiting not specified, unspecified vomiting type  Decreased appetite  Stage 3 chronic kidney disease (HCC)  Unfortunately, we are unable to obtain a blood sample for stat chemistry after 3 attempts.  He is a very high risk patient is his GFR is not 30s and has CKD.  He also has severe anemia and is receiving iron injections.  Without stat chemistry I am unable to evaluate patient and his stability.  However, I counseled that it would be important for him to obtain this as soon as possible  by going to the ER tonight to make sure he does not have a rapid decline.  Patient is in agreement and will head to the ER now.  Patient was provided with Zofran prior to leaving.     Wallis Bamberg, [place] 10/07/18 2057

## 2018-10-07 NOTE — Discharge Instructions (Addendum)
I am sorry we could not obtain a blood draw today.  I do think it would be important for you to report to the ER to make sure that you are not having severe anemia as your last hemoglobin was 8.7 a week ago.  I am also very concerned that you are severely dehydrated as you have been able to eat very much and is a huge risk given your chronic kidney disease.  Please report to the ER so that you can be further managed including labs.

## 2018-10-09 ENCOUNTER — Encounter (HOSPITAL_COMMUNITY): Payer: PRIVATE HEALTH INSURANCE

## 2018-10-09 ENCOUNTER — Encounter (INDEPENDENT_AMBULATORY_CARE_PROVIDER_SITE_OTHER): Payer: Self-pay | Admitting: Physician Assistant

## 2018-10-16 ENCOUNTER — Encounter (HOSPITAL_COMMUNITY)
Admission: RE | Admit: 2018-10-16 | Discharge: 2018-10-16 | Disposition: A | Discharge status: Home / Self Care | Payer: Medicaid Other | Source: Ambulatory Visit | Attending: Nephrology | Admitting: Nephrology

## 2018-10-16 VITALS — BP 133/75 | HR 94 | Resp 18

## 2018-10-16 DIAGNOSIS — N183 Chronic kidney disease, stage 3 (moderate): Secondary | ICD-10-CM | POA: Insufficient documentation

## 2018-10-16 DIAGNOSIS — N179 Acute kidney failure, unspecified: Secondary | ICD-10-CM

## 2018-10-16 DIAGNOSIS — D631 Anemia in chronic kidney disease: Secondary | ICD-10-CM | POA: Diagnosis not present

## 2018-10-16 LAB — POCT HEMOGLOBIN-HEMACUE: HEMOGLOBIN: 8.1 g/dL — AB (ref 13.0–17.0)

## 2018-10-16 MED ORDER — EPOETIN ALFA-EPBX 10000 UNIT/ML IJ SOLN
5000.0000 [IU] | INTRAMUSCULAR | Status: DC
  Filled 2018-10-16: qty 1

## 2018-10-16 MED ORDER — EPOETIN ALFA-EPBX 3000 UNIT/ML IJ SOLN
3000.0000 [IU] | Freq: Once | INTRAMUSCULAR | Status: AC
  Administered 2018-10-16: 3000 [IU] via SUBCUTANEOUS
  Filled 2018-10-16: qty 1

## 2018-10-16 MED ORDER — EPOETIN ALFA-EPBX 2000 UNIT/ML IJ SOLN
2000.0000 [IU] | Freq: Once | INTRAMUSCULAR | Status: AC
  Administered 2018-10-16: 2000 [IU] via SUBCUTANEOUS
  Filled 2018-10-16: qty 1

## 2018-10-23 ENCOUNTER — Ambulatory Visit (HOSPITAL_COMMUNITY)
Admission: RE | Admit: 2018-10-23 | Discharge: 2018-10-23 | Disposition: A | Discharge status: Home / Self Care | Payer: Medicaid Other | Source: Ambulatory Visit | Attending: Nephrology | Admitting: Nephrology

## 2018-10-23 VITALS — BP 140/85 | HR 95 | Temp 97.7°F | Resp 20

## 2018-10-23 DIAGNOSIS — N179 Acute kidney failure, unspecified: Secondary | ICD-10-CM | POA: Diagnosis not present

## 2018-10-23 LAB — POCT HEMOGLOBIN-HEMACUE: Hemoglobin: 8.5 g/dL — ABNORMAL LOW (ref 13.0–17.0)

## 2018-10-23 LAB — IRON AND TIBC
Iron: 51 ug/dL (ref 45–182)
Saturation Ratios: 20 % (ref 17.9–39.5)
TIBC: 260 ug/dL (ref 250–450)
UIBC: 209 ug/dL

## 2018-10-23 LAB — FERRITIN: Ferritin: 160 ng/mL (ref 24–336)

## 2018-10-23 MED ORDER — EPOETIN ALFA-EPBX 2000 UNIT/ML IJ SOLN
2000.0000 [IU] | Freq: Once | INTRAMUSCULAR | Status: AC
  Administered 2018-10-23: 2000 [IU] via SUBCUTANEOUS
  Filled 2018-10-23: qty 1

## 2018-10-23 MED ORDER — EPOETIN ALFA-EPBX 10000 UNIT/ML IJ SOLN: 5000.0000 [IU] | INTRAMUSCULAR | Status: DC

## 2018-10-23 MED ORDER — EPOETIN ALFA-EPBX 3000 UNIT/ML IJ SOLN
3000.0000 [IU] | Freq: Once | INTRAMUSCULAR | Status: AC
  Administered 2018-10-23: 3000 [IU] via SUBCUTANEOUS
  Filled 2018-10-23: qty 1

## 2018-10-30 ENCOUNTER — Ambulatory Visit (HOSPITAL_COMMUNITY)
Admission: RE | Admit: 2018-10-30 | Discharge: 2018-10-30 | Disposition: A | Discharge status: Home / Self Care | Payer: Medicaid Other | Source: Ambulatory Visit | Attending: Nephrology | Admitting: Nephrology

## 2018-10-30 VITALS — BP 147/91 | HR 85 | Temp 97.8°F | Resp 20

## 2018-10-30 DIAGNOSIS — D631 Anemia in chronic kidney disease: Secondary | ICD-10-CM | POA: Insufficient documentation

## 2018-10-30 DIAGNOSIS — N183 Chronic kidney disease, stage 3 (moderate): Secondary | ICD-10-CM | POA: Insufficient documentation

## 2018-10-30 DIAGNOSIS — N179 Acute kidney failure, unspecified: Secondary | ICD-10-CM | POA: Diagnosis not present

## 2018-10-30 LAB — POCT HEMOGLOBIN-HEMACUE: HEMOGLOBIN: 8.2 g/dL — AB (ref 13.0–17.0)

## 2018-10-30 MED ORDER — EPOETIN ALFA-EPBX 3000 UNIT/ML IJ SOLN
3000.0000 [IU] | Freq: Once | INTRAMUSCULAR | Status: AC
  Administered 2018-10-30: 3000 [IU] via SUBCUTANEOUS
  Filled 2018-10-30: qty 1

## 2018-10-30 MED ORDER — EPOETIN ALFA-EPBX 2000 UNIT/ML IJ SOLN
2000.0000 [IU] | Freq: Once | INTRAMUSCULAR | Status: AC
  Administered 2018-10-30: 2000 [IU] via SUBCUTANEOUS
  Filled 2018-10-30: qty 1

## 2018-10-30 MED ORDER — EPOETIN ALFA-EPBX 10000 UNIT/ML IJ SOLN: 5000.0000 [IU] | INTRAMUSCULAR | Status: DC

## 2018-11-06 ENCOUNTER — Ambulatory Visit (HOSPITAL_COMMUNITY)
Admission: RE | Admit: 2018-11-06 | Discharge: 2018-11-06 | Disposition: A | Discharge status: Home / Self Care | Payer: Medicaid Other | Source: Ambulatory Visit | Attending: Nephrology | Admitting: Nephrology

## 2018-11-06 VITALS — BP 167/94 | HR 94 | Temp 97.9°F | Resp 20

## 2018-11-06 DIAGNOSIS — N179 Acute kidney failure, unspecified: Secondary | ICD-10-CM

## 2018-11-06 LAB — POCT HEMOGLOBIN-HEMACUE: Hemoglobin: 8.2 g/dL — ABNORMAL LOW (ref 13.0–17.0)

## 2018-11-06 MED ORDER — EPOETIN ALFA-EPBX 3000 UNIT/ML IJ SOLN
3000.0000 [IU] | INTRAMUSCULAR | Status: DC
  Administered 2018-11-06: 3000 [IU] via SUBCUTANEOUS
  Filled 2018-11-06: qty 1

## 2018-11-06 MED ORDER — EPOETIN ALFA-EPBX 10000 UNIT/ML IJ SOLN: 5000.0000 [IU] | INTRAMUSCULAR | Status: DC

## 2018-11-06 MED ORDER — EPOETIN ALFA-EPBX 2000 UNIT/ML IJ SOLN
2000.0000 [IU] | INTRAMUSCULAR | Status: DC
  Administered 2018-11-06: 2000 [IU] via SUBCUTANEOUS
  Filled 2018-11-06: qty 1

## 2018-11-13 ENCOUNTER — Ambulatory Visit (HOSPITAL_COMMUNITY)
Admission: RE | Admit: 2018-11-13 | Discharge: 2018-11-13 | Disposition: A | Discharge status: Home / Self Care | Payer: Medicaid Other | Source: Ambulatory Visit | Attending: Nephrology | Admitting: Nephrology

## 2018-11-13 VITALS — BP 152/84 | HR 88 | Temp 98.2°F | Resp 20

## 2018-11-13 DIAGNOSIS — N179 Acute kidney failure, unspecified: Secondary | ICD-10-CM | POA: Insufficient documentation

## 2018-11-13 LAB — POCT HEMOGLOBIN-HEMACUE: Hemoglobin: 8.8 g/dL — ABNORMAL LOW (ref 13.0–17.0)

## 2018-11-13 MED ORDER — EPOETIN ALFA-EPBX 2000 UNIT/ML IJ SOLN
2000.0000 [IU] | Freq: Once | INTRAMUSCULAR | Status: AC
  Administered 2018-11-13: 2000 [IU] via SUBCUTANEOUS
  Filled 2018-11-13: qty 1

## 2018-11-13 MED ORDER — EPOETIN ALFA-EPBX 3000 UNIT/ML IJ SOLN
3000.0000 [IU] | Freq: Once | INTRAMUSCULAR | Status: AC
  Administered 2018-11-13: 3000 [IU] via SUBCUTANEOUS
  Filled 2018-11-13: qty 1

## 2018-11-13 MED ORDER — EPOETIN ALFA-EPBX 10000 UNIT/ML IJ SOLN: 5000.0000 [IU] | INTRAMUSCULAR | Status: DC

## 2018-11-14 ENCOUNTER — Other Ambulatory Visit (INDEPENDENT_AMBULATORY_CARE_PROVIDER_SITE_OTHER): Payer: Self-pay | Admitting: Physician Assistant

## 2018-11-14 NOTE — Telephone Encounter (Signed)
FWD to PCP. Zivah Mayr S Alia Parsley, CMA  

## 2018-11-21 ENCOUNTER — Encounter (HOSPITAL_COMMUNITY): Payer: PRIVATE HEALTH INSURANCE

## 2018-11-28 ENCOUNTER — Encounter (HOSPITAL_COMMUNITY)
Admission: RE | Admit: 2018-11-28 | Discharge: 2018-11-28 | Disposition: A | Discharge status: Home / Self Care | Payer: BLUE CROSS/BLUE SHIELD | Source: Ambulatory Visit | Attending: Nephrology | Admitting: Nephrology

## 2018-11-28 VITALS — BP 140/77 | HR 90 | Temp 98.6°F | Resp 20

## 2018-11-28 DIAGNOSIS — N183 Chronic kidney disease, stage 3 (moderate): Secondary | ICD-10-CM | POA: Diagnosis present

## 2018-11-28 DIAGNOSIS — D631 Anemia in chronic kidney disease: Secondary | ICD-10-CM | POA: Diagnosis not present

## 2018-11-28 DIAGNOSIS — N179 Acute kidney failure, unspecified: Secondary | ICD-10-CM

## 2018-11-28 LAB — FERRITIN: Ferritin: 191 ng/mL (ref 24–336)

## 2018-11-28 LAB — IRON AND TIBC
Iron: 79 ug/dL (ref 45–182)
Saturation Ratios: 28 % (ref 17.9–39.5)
TIBC: 279 ug/dL (ref 250–450)
UIBC: 200 ug/dL

## 2018-11-28 LAB — POCT HEMOGLOBIN-HEMACUE: HEMOGLOBIN: 8.9 g/dL — AB (ref 13.0–17.0)

## 2018-11-28 MED ORDER — EPOETIN ALFA-EPBX 2000 UNIT/ML IJ SOLN
2000.0000 [IU] | Freq: Once | INTRAMUSCULAR | Status: AC
  Administered 2018-11-28: 2000 [IU] via SUBCUTANEOUS
  Filled 2018-11-28: qty 1

## 2018-11-28 MED ORDER — EPOETIN ALFA-EPBX 10000 UNIT/ML IJ SOLN: 5000.0000 [IU] | INTRAMUSCULAR | Status: DC

## 2018-11-28 MED ORDER — EPOETIN ALFA-EPBX 3000 UNIT/ML IJ SOLN
3000.0000 [IU] | Freq: Once | INTRAMUSCULAR | Status: AC
  Administered 2018-11-28: 3000 [IU] via SUBCUTANEOUS
  Filled 2018-11-28: qty 1

## 2018-12-04 ENCOUNTER — Ambulatory Visit (HOSPITAL_COMMUNITY)
Admission: RE | Admit: 2018-12-04 | Discharge: 2018-12-04 | Disposition: A | Discharge status: Home / Self Care | Payer: BLUE CROSS/BLUE SHIELD | Source: Ambulatory Visit | Attending: Nephrology | Admitting: Nephrology

## 2018-12-04 ENCOUNTER — Other Ambulatory Visit (INDEPENDENT_AMBULATORY_CARE_PROVIDER_SITE_OTHER): Payer: Self-pay | Admitting: Physician Assistant

## 2018-12-04 VITALS — BP 127/76 | HR 86 | Temp 97.7°F | Resp 20

## 2018-12-04 DIAGNOSIS — I10 Essential (primary) hypertension: Secondary | ICD-10-CM

## 2018-12-04 DIAGNOSIS — N179 Acute kidney failure, unspecified: Secondary | ICD-10-CM | POA: Diagnosis not present

## 2018-12-04 LAB — POCT HEMOGLOBIN-HEMACUE: Hemoglobin: 8.7 g/dL — ABNORMAL LOW (ref 13.0–17.0)

## 2018-12-04 MED ORDER — EPOETIN ALFA-EPBX 10000 UNIT/ML IJ SOLN: 5000.0000 [IU] | INTRAMUSCULAR | Status: DC

## 2018-12-04 MED ORDER — EPOETIN ALFA-EPBX 2000 UNIT/ML IJ SOLN
2000.0000 [IU] | Freq: Once | INTRAMUSCULAR | Status: AC
  Administered 2018-12-04: 2000 [IU] via SUBCUTANEOUS
  Filled 2018-12-04: qty 1

## 2018-12-04 MED ORDER — EPOETIN ALFA-EPBX 3000 UNIT/ML IJ SOLN
3000.0000 [IU] | Freq: Once | INTRAMUSCULAR | Status: AC
  Administered 2018-12-04: 3000 [IU] via SUBCUTANEOUS
  Filled 2018-12-04: qty 1

## 2018-12-04 NOTE — Telephone Encounter (Signed)
FWD to PCP. Precilla Purnell S Sheri Prows, CMA  

## 2018-12-05 ENCOUNTER — Encounter (HOSPITAL_COMMUNITY): Payer: Medicaid Other

## 2018-12-11 ENCOUNTER — Ambulatory Visit (HOSPITAL_COMMUNITY)
Admission: RE | Admit: 2018-12-11 | Discharge: 2018-12-11 | Disposition: A | Discharge status: Home / Self Care | Payer: BLUE CROSS/BLUE SHIELD | Source: Ambulatory Visit | Attending: Nephrology | Admitting: Nephrology

## 2018-12-11 VITALS — BP 121/71 | HR 81 | Temp 98.2°F | Resp 20

## 2018-12-11 DIAGNOSIS — N179 Acute kidney failure, unspecified: Secondary | ICD-10-CM

## 2018-12-11 LAB — POCT HEMOGLOBIN-HEMACUE: Hemoglobin: 8.5 g/dL — ABNORMAL LOW (ref 13.0–17.0)

## 2018-12-11 MED ORDER — EPOETIN ALFA-EPBX 10000 UNIT/ML IJ SOLN: 5000.0000 [IU] | INTRAMUSCULAR | Status: DC

## 2018-12-11 MED ORDER — EPOETIN ALFA-EPBX 2000 UNIT/ML IJ SOLN
2000.0000 [IU] | Freq: Once | INTRAMUSCULAR | Status: AC
  Administered 2018-12-11: 2000 [IU] via SUBCUTANEOUS
  Filled 2018-12-11: qty 1

## 2018-12-11 MED ORDER — EPOETIN ALFA-EPBX 3000 UNIT/ML IJ SOLN
3000.0000 [IU] | Freq: Once | INTRAMUSCULAR | Status: AC
  Administered 2018-12-11: 3000 [IU] via SUBCUTANEOUS
  Filled 2018-12-11: qty 1

## 2018-12-18 ENCOUNTER — Ambulatory Visit (HOSPITAL_COMMUNITY)
Admission: RE | Admit: 2018-12-18 | Discharge: 2018-12-18 | Disposition: A | Discharge status: Home / Self Care | Payer: BLUE CROSS/BLUE SHIELD | Source: Ambulatory Visit | Attending: Nephrology | Admitting: Nephrology

## 2018-12-18 VITALS — BP 133/80 | HR 56 | Temp 98.0°F | Resp 20

## 2018-12-18 DIAGNOSIS — N179 Acute kidney failure, unspecified: Secondary | ICD-10-CM | POA: Diagnosis not present

## 2018-12-18 LAB — POCT HEMOGLOBIN-HEMACUE: Hemoglobin: 8.5 g/dL — ABNORMAL LOW (ref 13.0–17.0)

## 2018-12-18 MED ORDER — EPOETIN ALFA-EPBX 2000 UNIT/ML IJ SOLN
2000.0000 [IU] | Freq: Once | INTRAMUSCULAR | Status: AC
  Administered 2018-12-18: 2000 [IU] via SUBCUTANEOUS
  Filled 2018-12-18: qty 1

## 2018-12-18 MED ORDER — EPOETIN ALFA-EPBX 10000 UNIT/ML IJ SOLN: 5000.0000 [IU] | INTRAMUSCULAR | Status: DC

## 2018-12-18 MED ORDER — EPOETIN ALFA-EPBX 3000 UNIT/ML IJ SOLN
3000.0000 [IU] | Freq: Once | INTRAMUSCULAR | Status: AC
  Administered 2018-12-18: 3000 [IU] via SUBCUTANEOUS
  Filled 2018-12-18: qty 1

## 2018-12-23 ENCOUNTER — Other Ambulatory Visit: Payer: Self-pay | Admitting: Internal Medicine

## 2018-12-23 MED ORDER — FUROSEMIDE 40 MG PO TABS: 40.0000 mg | ORAL_TABLET | Freq: Every day | ORAL | 0 refills | Status: DC | PRN

## 2018-12-25 ENCOUNTER — Ambulatory Visit (HOSPITAL_COMMUNITY)
Admission: RE | Admit: 2018-12-25 | Discharge: 2018-12-25 | Disposition: A | Discharge status: Home / Self Care | Payer: BLUE CROSS/BLUE SHIELD | Source: Ambulatory Visit | Attending: Nephrology | Admitting: Nephrology

## 2018-12-25 VITALS — BP 136/82 | HR 94

## 2018-12-25 DIAGNOSIS — N179 Acute kidney failure, unspecified: Secondary | ICD-10-CM | POA: Insufficient documentation

## 2018-12-25 LAB — POCT HEMOGLOBIN-HEMACUE: Hemoglobin: 10.2 g/dL — ABNORMAL LOW (ref 13.0–17.0)

## 2018-12-25 MED ORDER — EPOETIN ALFA-EPBX 3000 UNIT/ML IJ SOLN
3000.0000 [IU] | INTRAMUSCULAR | Status: DC
  Administered 2018-12-25: 3000 [IU] via SUBCUTANEOUS
  Filled 2018-12-25: qty 1

## 2018-12-25 MED ORDER — EPOETIN ALFA-EPBX 10000 UNIT/ML IJ SOLN: 5000.0000 [IU] | INTRAMUSCULAR | Status: DC

## 2018-12-25 MED ORDER — EPOETIN ALFA-EPBX 2000 UNIT/ML IJ SOLN
2000.0000 [IU] | INTRAMUSCULAR | Status: DC
  Administered 2018-12-25: 2000 [IU] via SUBCUTANEOUS
  Filled 2018-12-25: qty 1

## 2018-12-26 MED FILL — Epoetin Alfa-epbx Inj 3000 Unit/ML: INTRAMUSCULAR | Qty: 1 | Status: AC

## 2019-01-01 ENCOUNTER — Ambulatory Visit (INDEPENDENT_AMBULATORY_CARE_PROVIDER_SITE_OTHER): Payer: BLUE CROSS/BLUE SHIELD | Admitting: Nurse Practitioner

## 2019-01-01 ENCOUNTER — Ambulatory Visit (HOSPITAL_COMMUNITY)
Admission: RE | Admit: 2019-01-01 | Discharge: 2019-01-01 | Disposition: A | Discharge status: Home / Self Care | Payer: BLUE CROSS/BLUE SHIELD | Source: Ambulatory Visit | Attending: Nephrology | Admitting: Nephrology

## 2019-01-01 VITALS — BP 146/83 | HR 90 | Temp 98.2°F | Resp 20

## 2019-01-01 DIAGNOSIS — N179 Acute kidney failure, unspecified: Secondary | ICD-10-CM | POA: Diagnosis present

## 2019-01-01 LAB — IRON AND TIBC
Iron: 53 ug/dL (ref 45–182)
Saturation Ratios: 20 % (ref 17.9–39.5)
TIBC: 267 ug/dL (ref 250–450)
UIBC: 214 ug/dL

## 2019-01-01 LAB — FERRITIN: Ferritin: 122 ng/mL (ref 24–336)

## 2019-01-01 LAB — POCT HEMOGLOBIN-HEMACUE: HEMOGLOBIN: 9.2 g/dL — AB (ref 13.0–17.0)

## 2019-01-01 MED ORDER — EPOETIN ALFA-EPBX 3000 UNIT/ML IJ SOLN
3000.0000 [IU] | INTRAMUSCULAR | Status: DC
  Administered 2019-01-01: 3000 [IU] via SUBCUTANEOUS
  Filled 2019-01-01: qty 1

## 2019-01-01 MED ORDER — EPOETIN ALFA-EPBX 2000 UNIT/ML IJ SOLN
2000.0000 [IU] | INTRAMUSCULAR | Status: DC
  Administered 2019-01-01: 2000 [IU] via SUBCUTANEOUS
  Filled 2019-01-01: qty 1

## 2019-01-01 MED ORDER — EPOETIN ALFA-EPBX 10000 UNIT/ML IJ SOLN: 5000.0000 [IU] | INTRAMUSCULAR | Status: DC

## 2019-01-08 ENCOUNTER — Ambulatory Visit (HOSPITAL_COMMUNITY)
Admission: RE | Admit: 2019-01-08 | Discharge: 2019-01-08 | Disposition: A | Discharge status: Home / Self Care | Payer: BLUE CROSS/BLUE SHIELD | Source: Ambulatory Visit | Attending: Nephrology | Admitting: Nephrology

## 2019-01-08 VITALS — BP 145/90 | HR 100 | Temp 98.1°F | Resp 20

## 2019-01-08 DIAGNOSIS — N179 Acute kidney failure, unspecified: Secondary | ICD-10-CM | POA: Insufficient documentation

## 2019-01-08 LAB — POCT HEMOGLOBIN-HEMACUE: Hemoglobin: 9.7 g/dL — ABNORMAL LOW (ref 13.0–17.0)

## 2019-01-08 MED ORDER — EPOETIN ALFA-EPBX 2000 UNIT/ML IJ SOLN
2000.0000 [IU] | Freq: Once | INTRAMUSCULAR | Status: AC
  Administered 2019-01-08: 2000 [IU] via SUBCUTANEOUS
  Filled 2019-01-08: qty 1

## 2019-01-08 MED ORDER — EPOETIN ALFA-EPBX 3000 UNIT/ML IJ SOLN
3000.0000 [IU] | Freq: Once | INTRAMUSCULAR | Status: AC
  Administered 2019-01-08: 3000 [IU] via SUBCUTANEOUS
  Filled 2019-01-08: qty 1

## 2019-01-08 MED ORDER — EPOETIN ALFA-EPBX 10000 UNIT/ML IJ SOLN: 5000.0000 [IU] | INTRAMUSCULAR | Status: DC

## 2019-01-15 ENCOUNTER — Ambulatory Visit (HOSPITAL_COMMUNITY)
Admission: RE | Admit: 2019-01-15 | Discharge: 2019-01-15 | Disposition: A | Discharge status: Home / Self Care | Payer: BLUE CROSS/BLUE SHIELD | Source: Ambulatory Visit | Attending: Nephrology | Admitting: Nephrology

## 2019-01-15 VITALS — BP 121/77 | HR 86 | Temp 98.6°F | Resp 20

## 2019-01-15 DIAGNOSIS — N179 Acute kidney failure, unspecified: Secondary | ICD-10-CM | POA: Insufficient documentation

## 2019-01-15 LAB — POCT HEMOGLOBIN-HEMACUE: Hemoglobin: 8.9 g/dL — ABNORMAL LOW (ref 13.0–17.0)

## 2019-01-15 MED ORDER — EPOETIN ALFA-EPBX 10000 UNIT/ML IJ SOLN
5000.0000 [IU] | INTRAMUSCULAR | Status: DC
  Administered 2019-01-15: 5000 [IU] via SUBCUTANEOUS
  Filled 2019-01-15: qty 1

## 2019-01-15 MED ORDER — EPOETIN ALFA-EPBX 3000 UNIT/ML IJ SOLN
3000.0000 [IU] | INTRAMUSCULAR | Status: DC
  Filled 2019-01-15: qty 1

## 2019-01-15 MED ORDER — EPOETIN ALFA-EPBX 2000 UNIT/ML IJ SOLN
2000.0000 [IU] | INTRAMUSCULAR | Status: DC
  Filled 2019-01-15: qty 1

## 2019-01-16 IMAGING — CR DG CHEST 2V
2 series · 2 of 2 positions shown · non-contrast
Comparison: Prior radiograph from 01/04/16

CLINICAL DATA: Initial evaluation for bilateral leg edema for 1
month, shortness of breath.

EXAM:
CHEST - 2 VIEW

[chest pa]
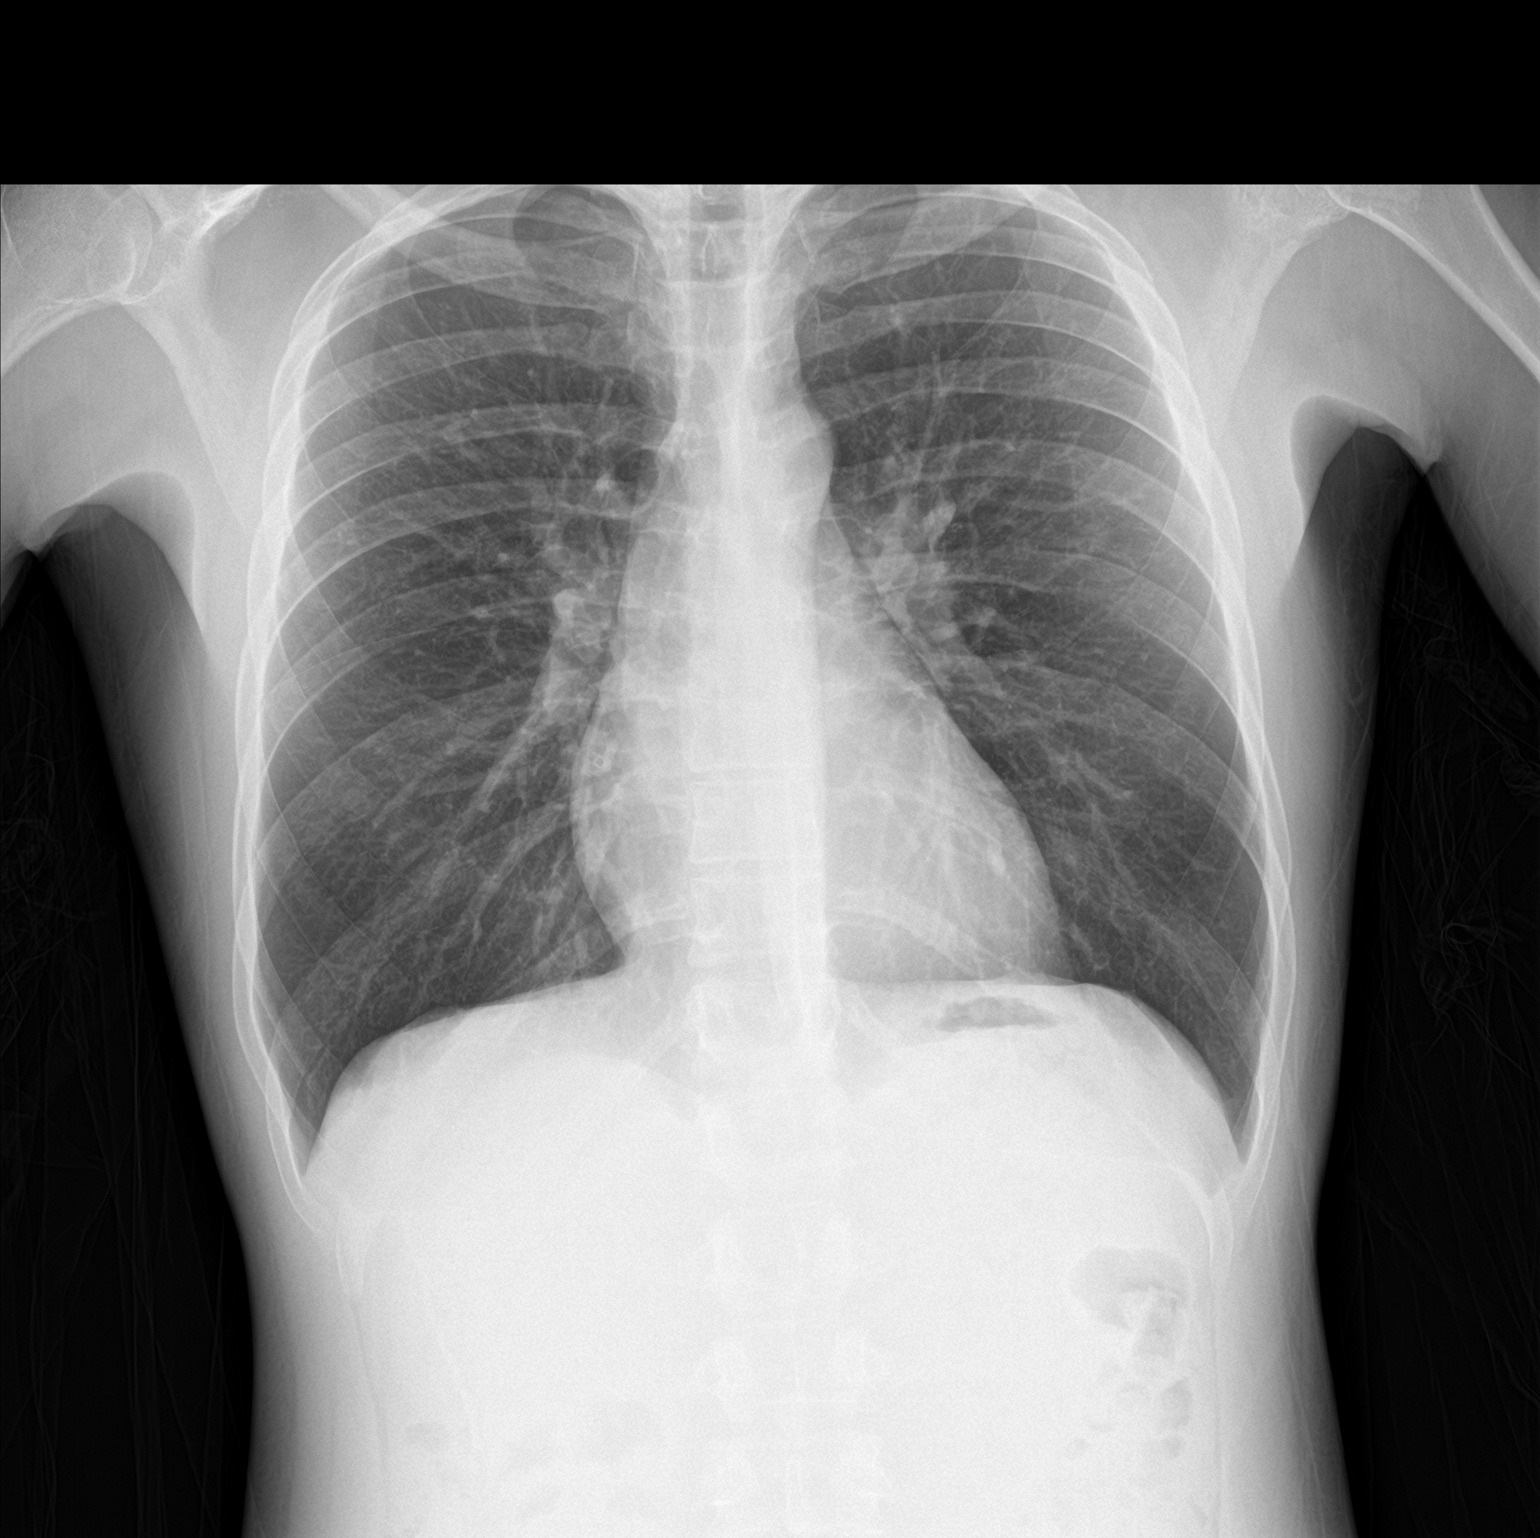

[chest lat]
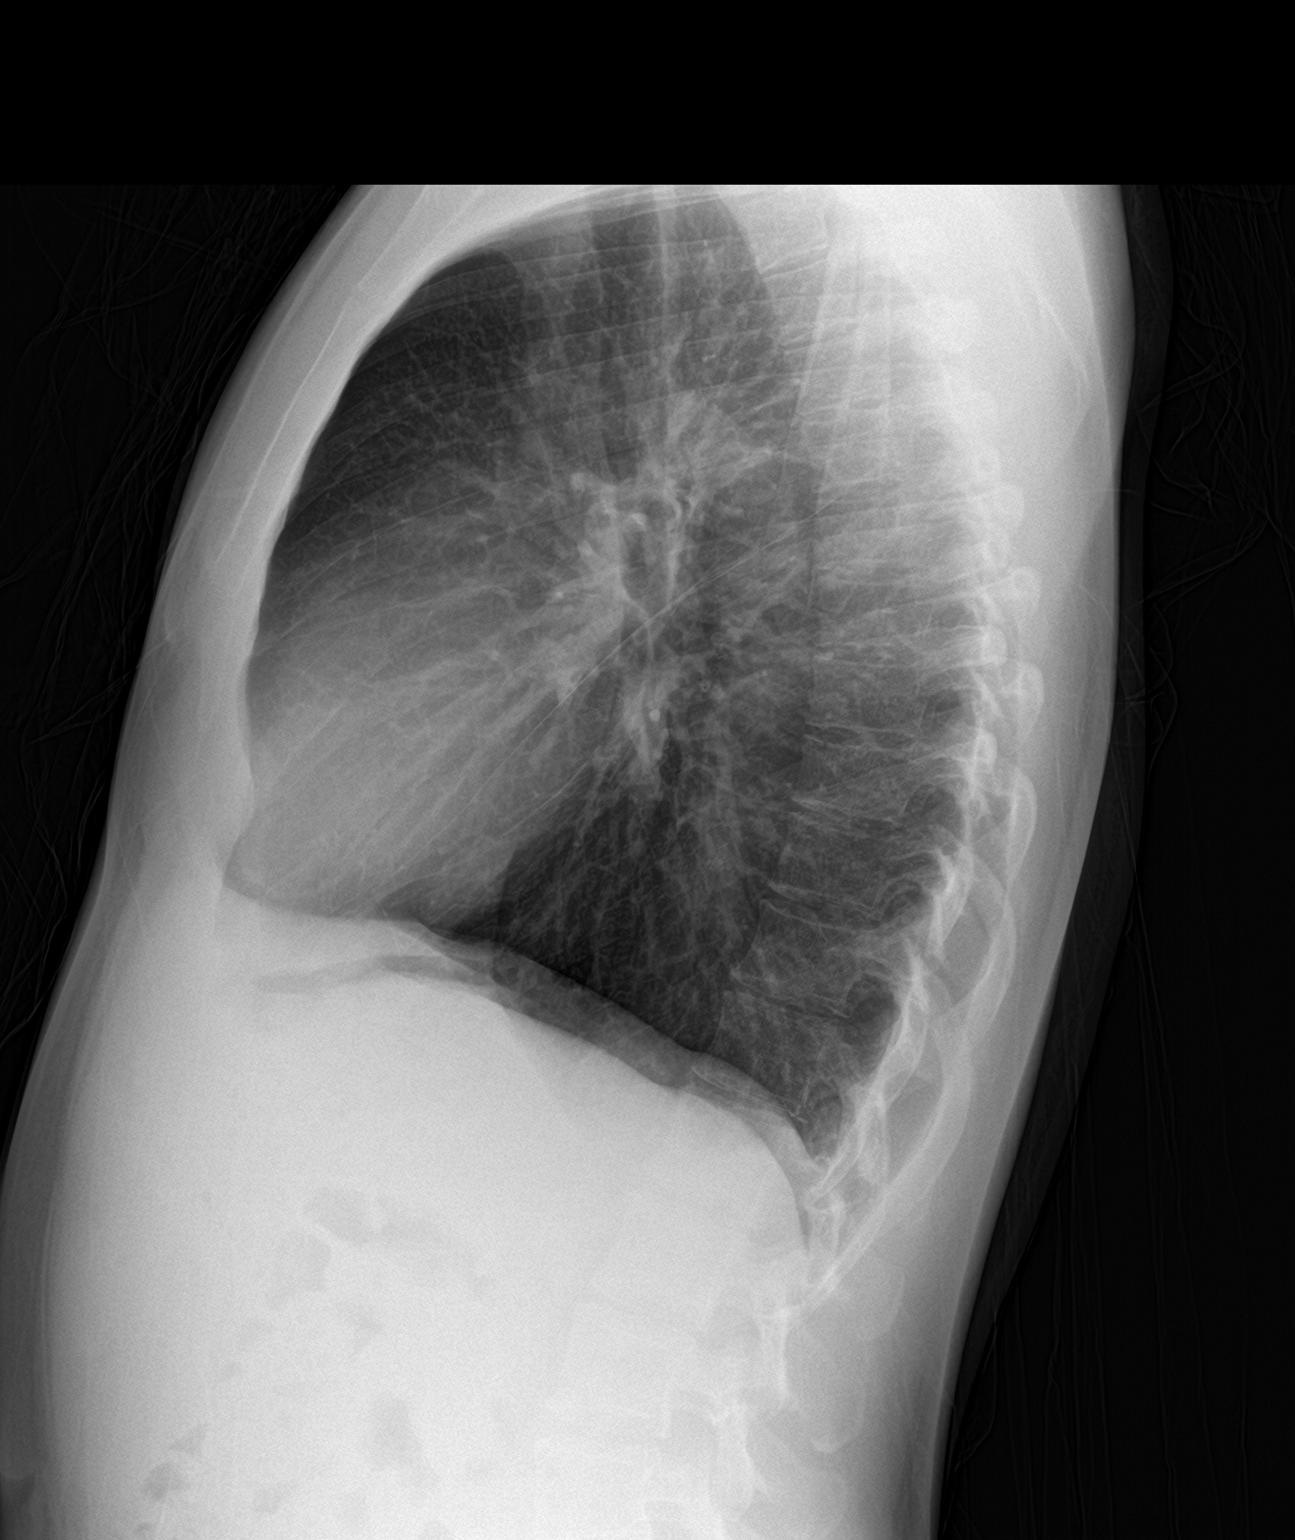

[2 of 2 positions shown; findings below may reference images not displayed]

FINDINGS: The cardiac and mediastinal silhouettes are stable in size and
contour, and remain within normal limits.

The lungs are normally inflated. No airspace consolidation, pleural
effusion, or pulmonary edema is identified. There is no
pneumothorax.

No acute osseous abnormality identified.
IMPRESSION: No active cardiopulmonary disease.

## 2019-01-22 ENCOUNTER — Ambulatory Visit (HOSPITAL_COMMUNITY)
Admission: RE | Admit: 2019-01-22 | Discharge: 2019-01-22 | Disposition: A | Discharge status: Home / Self Care | Payer: BLUE CROSS/BLUE SHIELD | Source: Ambulatory Visit | Attending: Nephrology | Admitting: Nephrology

## 2019-01-22 ENCOUNTER — Encounter (INDEPENDENT_AMBULATORY_CARE_PROVIDER_SITE_OTHER): Payer: Self-pay | Admitting: Primary Care

## 2019-01-22 ENCOUNTER — Encounter (HOSPITAL_COMMUNITY): Payer: BLUE CROSS/BLUE SHIELD

## 2019-01-22 ENCOUNTER — Other Ambulatory Visit: Payer: Self-pay

## 2019-01-22 ENCOUNTER — Ambulatory Visit (INDEPENDENT_AMBULATORY_CARE_PROVIDER_SITE_OTHER): Payer: BLUE CROSS/BLUE SHIELD | Admitting: Primary Care

## 2019-01-22 VITALS — BP 162/95 | HR 87 | Temp 97.9°F | Ht 67.0 in | Wt 148.6 lb

## 2019-01-22 VITALS — BP 148/90 | HR 92 | Temp 97.9°F | Resp 20

## 2019-01-22 DIAGNOSIS — J4520 Mild intermittent asthma, uncomplicated: Secondary | ICD-10-CM

## 2019-01-22 DIAGNOSIS — N179 Acute kidney failure, unspecified: Secondary | ICD-10-CM | POA: Diagnosis not present

## 2019-01-22 DIAGNOSIS — E1121 Type 2 diabetes mellitus with diabetic nephropathy: Secondary | ICD-10-CM | POA: Diagnosis not present

## 2019-01-22 DIAGNOSIS — I10 Essential (primary) hypertension: Secondary | ICD-10-CM

## 2019-01-22 DIAGNOSIS — Z23 Encounter for immunization: Secondary | ICD-10-CM | POA: Diagnosis not present

## 2019-01-22 LAB — IRON AND TIBC
Iron: 88 ug/dL (ref 45–182)
Saturation Ratios: 32 % (ref 17.9–39.5)
TIBC: 274 ug/dL (ref 250–450)
UIBC: 186 ug/dL

## 2019-01-22 LAB — POCT GLYCOSYLATED HEMOGLOBIN (HGB A1C): Hemoglobin A1C: 5.7 % — AB (ref 4.0–5.6)

## 2019-01-22 LAB — FERRITIN: FERRITIN: 118 ng/mL (ref 24–336)

## 2019-01-22 LAB — POCT HEMOGLOBIN-HEMACUE: Hemoglobin: 10 g/dL — ABNORMAL LOW (ref 13.0–17.0)

## 2019-01-22 LAB — GLUCOSE, POCT (MANUAL RESULT ENTRY): POC Glucose: 117 mg/dl — AB (ref 70–99)

## 2019-01-22 MED ORDER — CARVEDILOL 6.25 MG PO TABS: 6.2500 mg | ORAL_TABLET | Freq: Two times a day (BID) | ORAL | 5 refills | Status: AC

## 2019-01-22 MED ORDER — ALBUTEROL SULFATE HFA 108 (90 BASE) MCG/ACT IN AERS: 2.0000 | INHALATION_SPRAY | RESPIRATORY_TRACT | 2 refills | Status: DC | PRN

## 2019-01-22 MED ORDER — AMLODIPINE BESYLATE 10 MG PO TABS: 10.0000 mg | ORAL_TABLET | Freq: Every day | ORAL | 0 refills | Status: AC

## 2019-01-22 MED ORDER — ATORVASTATIN CALCIUM 40 MG PO TABS: 40.0000 mg | ORAL_TABLET | Freq: Every day | ORAL | 3 refills | Status: AC

## 2019-01-22 MED ORDER — FUROSEMIDE 40 MG PO TABS: 40.0000 mg | ORAL_TABLET | Freq: Every day | ORAL | 3 refills | Status: AC | PRN

## 2019-01-22 MED ORDER — EPOETIN ALFA-EPBX 2000 UNIT/ML IJ SOLN
2000.0000 [IU] | Freq: Once | INTRAMUSCULAR | Status: AC
  Administered 2019-01-22: 2000 [IU] via SUBCUTANEOUS
  Filled 2019-01-22: qty 1

## 2019-01-22 MED ORDER — EPOETIN ALFA-EPBX 10000 UNIT/ML IJ SOLN: 5000.0000 [IU] | INTRAMUSCULAR | Status: DC

## 2019-01-22 MED ORDER — GLIMEPIRIDE 2 MG PO TABS: 2.0000 mg | ORAL_TABLET | Freq: Every day | ORAL | 5 refills | Status: DC

## 2019-01-22 MED ORDER — EPOETIN ALFA-EPBX 3000 UNIT/ML IJ SOLN
3000.0000 [IU] | Freq: Once | INTRAMUSCULAR | Status: AC
  Administered 2019-01-22: 3000 [IU] via SUBCUTANEOUS
  Filled 2019-01-22: qty 1

## 2019-01-22 NOTE — Progress Notes (Signed)
Established Patient Office Visit  Subjective:  Patient ID: Keith Maynard, male    DOB: [birth year]  Age: 28 y.o. MRN: [id number]  CC: No chief complaint on file.   HPI Keith Maynard presents for routine follow up last visit 8/19 and medication refills. Marland Kitchen He is seen by nephrology Dr. Hyman Hopes weekly for a injection for anemia . He has a with medical history of asthma, DM2,and HTNpresentswith need for a glucometer. Last A1c 5.4% today A1C 5.7%. Bp is elevated but out of medication will refill.  Past Medical History:  Diagnosis Date  . Asthma   . Diabetes mellitus without complication (HCC)   . Hypertension     Past Surgical History:  Procedure Laterality Date  . EYE SURGERY      Family History  Problem Relation Age of Onset  . Diabetes Mother   . Stroke Father     Social History   Socioeconomic History  . Marital status: Single    Spouse name: Not on file  . Number of children: Not on file  . Years of education: Not on file  . Highest education level: Not on file  Occupational History  . Not on file  Social Needs  . Financial resource strain: Not on file  . Food insecurity:    Worry: Not on file    Inability: Not on file  . Transportation needs:    Medical: Not on file    Non-medical: Not on file  Tobacco Use  . Smoking status: Former Smoker    Types: Cigarettes    Last attempt to quit: 01/02/2018    Years since quitting: 1.0  . Smokeless tobacco: Never Used  Substance and Sexual Activity  . Alcohol use: Yes    Alcohol/week: 0.0 standard drinks  . Drug use: No  . Sexual activity: Not Currently  Lifestyle  . Physical activity:    Days per week: Not on file    Minutes per session: Not on file  . Stress: Not on file  Relationships  . Social connections:    Talks on phone: Not on file    Gets together: Not on file    Attends religious service: Not on file    Active member of club or organization: Not on file    Attends meetings of clubs or  organizations: Not on file    Relationship status: Not on file  . Intimate partner violence:    Fear of current or ex partner: Not on file    Emotionally abused: Not on file    Physically abused: Not on file    Forced sexual activity: Not on file  Other Topics Concern  . Not on file  Social History Narrative  . Not on file    Outpatient Medications Prior to Visit  Medication Sig Dispense Refill  . gentamicin (GARAMYCIN) 0.3 % ophthalmic solution Place 1 drop into the right eye 4 (four) times daily.     Marland Kitchen losartan (COZAAR) 50 MG tablet Take 1 tablet (50 mg total) by mouth daily. 90 tablet 3  . timolol (TIMOPTIC) 0.5 % ophthalmic solution Place 1 drop into the right eye 2 (two) times daily.    Marland Kitchen amLODipine (NORVASC) 10 MG tablet TAKE 1 TABLET BY MOUTH EVERY DAY 90 tablet 0  . atorvastatin (LIPITOR) 40 MG tablet Take 1 tablet (40 mg total) by mouth daily. 90 tablet 3  . furosemide (LASIX) 40 MG tablet Take 1 tablet (40 mg total) by mouth daily as  needed. 30 tablet 0  . glimepiride (AMARYL) 2 MG tablet Take 1 tablet (2 mg total) by mouth daily before breakfast. 30 tablet 5  . Blood Glucose Monitoring Suppl (TRUE METRIX METER) DEVI 1 each by Does not apply route daily. (Patient not taking: Reported on 01/22/2019) 1 Device 0  . CELLCEPT 250 MG capsule Take 250 mg by mouth.    Marland Kitchen glucose blood (TRUE METRIX BLOOD GLUCOSE TEST) test strip Use twice a day (Patient not taking: Reported on 01/22/2019) 100 each 12  . TRUEPLUS LANCETS 33G MISC Inject 1 each into the skin 2 (two) times daily. (Patient not taking: Reported on 01/22/2019) 60 each 5  . albuterol (PROVENTIL HFA;VENTOLIN HFA) 108 (90 Base) MCG/ACT inhaler Inhale 2 puffs into the lungs every 4 (four) hours as needed for wheezing or shortness of breath. 1 Inhaler 2  . carvedilol (COREG) 6.25 MG tablet Take 1 tablet (6.25 mg total) by mouth 2 (two) times daily with a meal. (Patient not taking: Reported on 01/22/2019) 60 tablet 5  . latanoprost  (XALATAN) 0.005 % ophthalmic solution Place 1 drop into the left eye at bedtime.    . prednisoLONE acetate (PRED FORTE) 1 % ophthalmic suspension Place 1 drop into the left eye 4 (four) times daily.     No facility-administered medications prior to visit.     Allergies  Allergen Reactions  . Other Shortness Of Breath    All nuts except peanuts-itching,nausea    ROS Review of Systems  Constitutional: Negative.   HENT: Negative.   Eyes: Negative.   Respiratory: Positive for shortness of breath.   Gastrointestinal: Negative.   Endocrine: Positive for polyuria.  Genitourinary: Negative.   Skin: Negative.   Allergic/Immunologic: Negative.   Neurological: Negative.   Hematological: Negative.   Psychiatric/Behavioral: Negative.       Objective:    Physical Exam  Constitutional: He is oriented to person, place, and time. He appears well-developed.  HENT:  Head: Normocephalic and atraumatic.  Eyes: Pupils are equal, round, and reactive to light. EOM are normal.  Neck: Normal range of motion.  Cardiovascular: Normal rate and regular rhythm.  Pulmonary/Chest: Effort normal. He has wheezes.  Abdominal: Soft. Bowel sounds are normal.  Musculoskeletal: Normal range of motion.        General: Edema present.       Feet:  Neurological: He is alert and oriented to person, place, and time.  Skin: Skin is warm and dry.  Psychiatric: He has a normal mood and affect.    BP (!) 162/95 (BP Location: Right Arm, Patient Position: Sitting, Cuff Size: Normal)   Pulse 87   Temp 97.9 F (36.6 C) (Oral)   Ht  (1.702 m)   Wt 148 lb 9.6 oz (67.4 kg)   SpO2 100%   BMI 23.27 kg/m  Wt Readings from Last 3 Encounters:  01/22/19 148 lb 9.6 oz (67.4 kg)  07/11/18 123 lb 6.4 oz (56 kg)  06/18/18 137 lb 12.6 oz (62.5 kg)     Health Maintenance Due  Topic Date Due  . FOOT EXAM  10/12/2001  . OPHTHALMOLOGY EXAM  10/12/2001  . TETANUS/TDAP  10/12/2010  . INFLUENZA VACCINE  06/27/2018    . HEMOGLOBIN A1C  12/16/2018    There are no preventive care reminders to display for this patient.  Lab Results  Component Value Date   TSH 1.738 06/15/2018   Lab Results  Component Value Date   WBC 5.8 07/11/2018   HGB 8.9 (  L) 01/15/2019   HCT 29.5 (L) 07/11/2018   MCV 88 07/11/2018   PLT 348 07/11/2018   Lab Results  Component Value Date   NA 144 07/11/2018   K 5.7 (H) 07/11/2018   CO2 20 07/11/2018   GLUCOSE 149 (H) 07/11/2018   BUN 36 (H) 07/11/2018   CREATININE 2.66 (H) 07/11/2018   BILITOT 0.6 06/15/2018   ALKPHOS 71 06/15/2018   AST 15 06/15/2018   ALT 18 06/15/2018   PROT 5.9 (L) 06/15/2018   ALBUMIN 2.9 (L) 06/15/2018   CALCIUM 9.5 07/11/2018   ANIONGAP 5 06/18/2018   Lab Results  Component Value Date   CHOL 284 (H) 07/11/2018   Lab Results  Component Value Date   HDL 78 07/11/2018   Lab Results  Component Value Date   LDLCALC 182 (H) 07/11/2018   Lab Results  Component Value Date   TRIG 121 07/11/2018   Lab Results  Component Value Date   CHOLHDL 3.6 07/11/2018   Lab Results  Component Value Date   HGBA1C 5.7 (A) 01/22/2019      Assessment & Plan:   Problem List Items Addressed This Visit    Asthma   Relevant Medications   albuterol (PROVENTIL HFA;VENTOLIN HFA) 108 (90 Base) MCG/ACT inhaler   ARF (acute renal failure) (HCC)   Essential hypertension   Relevant Medications   amLODipine (NORVASC) 10 MG tablet   atorvastatin (LIPITOR) 40 MG tablet   carvedilol (COREG) 6.25 MG tablet   furosemide (LASIX) 40 MG tablet    Other Visit Diagnoses    Type 2 diabetes mellitus with diabetic nephropathy, without long-term current use of insulin (HCC)    -  Primary   Relevant Medications   atorvastatin (LIPITOR) 40 MG tablet   glimepiride (AMARYL) 2 MG tablet   Other Relevant Orders   HgB A1c (Completed)   Glucose (CBG) (Completed)   Need for Tdap vaccination       Relevant Orders   Tdap vaccine greater than or equal to 7yo IM  (Completed)   Need for immunization against influenza       Relevant Orders   Flu Vaccine QUAD 36+ mos IM (Completed)   Hypertension, unspecified type       Relevant Medications elevated refill re ck 2 weeks    amLODipine (NORVASC) 10 MG tablet   atorvastatin (LIPITOR) 40 MG tablet   carvedilol (COREG) 6.25 MG tablet   furosemide (LASIX) 40 MG tablet      Meds ordered this encounter  Medications  . albuterol (PROVENTIL HFA;VENTOLIN HFA) 108 (90 Base) MCG/ACT inhaler    Sig: Inhale 2 puffs into the lungs every 4 (four) hours as needed for up to 30 days for wheezing or shortness of breath.    Dispense:  1 Inhaler    Refill:  2  . amLODipine (NORVASC) 10 MG tablet    Sig: Take 1 tablet (10 mg total) by mouth daily.    Dispense:  90 tablet    Refill:  0  . atorvastatin (LIPITOR) 40 MG tablet    Sig: Take 1 tablet (40 mg total) by mouth daily.    Dispense:  90 tablet    Refill:  3  . carvedilol (COREG) 6.25 MG tablet    Sig: Take 1 tablet (6.25 mg total) by mouth 2 (two) times daily with a meal.    Dispense:  60 tablet    Refill:  5  . furosemide (LASIX) 40 MG tablet    Sig:  Take 1 tablet (40 mg total) by mouth daily as needed.    Dispense:  30 tablet    Refill:  3  . glimepiride (AMARYL) 2 MG tablet    Sig: Take 1 tablet (2 mg total) by mouth daily before breakfast.    Dispense:  30 tablet    Refill:  5    Follow-up: Return in about 2 weeks (around 02/05/2019) for nurse visit Bp ck .    Grayce Sessions, NP

## 2019-01-22 NOTE — Patient Instructions (Signed)

## 2019-01-28 ENCOUNTER — Other Ambulatory Visit: Payer: Self-pay

## 2019-01-28 DIAGNOSIS — E119 Type 2 diabetes mellitus without complications: Secondary | ICD-10-CM

## 2019-01-28 MED ORDER — ONETOUCH VERIO W/DEVICE KIT: PACK | 0 refills | Status: AC

## 2019-01-28 MED ORDER — GLUCOSE BLOOD VI STRP: ORAL_STRIP | 12 refills | Status: AC

## 2019-01-28 MED ORDER — ONETOUCH DELICA LANCETS 33G MISC: 12 refills | Status: AC

## 2019-01-28 NOTE — Telephone Encounter (Signed)
Changed due to insurance preference.

## 2019-01-29 ENCOUNTER — Ambulatory Visit (HOSPITAL_COMMUNITY)
Admission: RE | Admit: 2019-01-29 | Discharge: 2019-01-29 | Disposition: A | Discharge status: Home / Self Care | Payer: BLUE CROSS/BLUE SHIELD | Source: Ambulatory Visit | Attending: Nephrology | Admitting: Nephrology

## 2019-01-29 VITALS — BP 149/87 | HR 97 | Resp 20

## 2019-01-29 DIAGNOSIS — N179 Acute kidney failure, unspecified: Secondary | ICD-10-CM | POA: Insufficient documentation

## 2019-01-29 MED ORDER — EPOETIN ALFA-EPBX 3000 UNIT/ML IJ SOLN
3000.0000 [IU] | Freq: Once | INTRAMUSCULAR | Status: AC
  Administered 2019-01-29: 3000 [IU] via SUBCUTANEOUS
  Filled 2019-01-29: qty 1

## 2019-01-29 MED ORDER — EPOETIN ALFA-EPBX 10000 UNIT/ML IJ SOLN: 5000.0000 [IU] | INTRAMUSCULAR | Status: DC

## 2019-01-29 MED ORDER — EPOETIN ALFA-EPBX 2000 UNIT/ML IJ SOLN
2000.0000 [IU] | Freq: Once | INTRAMUSCULAR | Status: AC
  Administered 2019-01-29: 2000 [IU] via SUBCUTANEOUS
  Filled 2019-01-29: qty 1

## 2019-01-30 LAB — POCT HEMOGLOBIN-HEMACUE: Hemoglobin: 9.9 g/dL — ABNORMAL LOW (ref 13.0–17.0)

## 2019-02-05 ENCOUNTER — Encounter (HOSPITAL_COMMUNITY): Payer: BLUE CROSS/BLUE SHIELD

## 2019-02-05 ENCOUNTER — Ambulatory Visit (HOSPITAL_COMMUNITY)
Admission: RE | Admit: 2019-02-05 | Discharge: 2019-02-05 | Disposition: A | Discharge status: Home / Self Care | Payer: BLUE CROSS/BLUE SHIELD | Source: Ambulatory Visit | Attending: Nephrology | Admitting: Nephrology

## 2019-02-05 ENCOUNTER — Other Ambulatory Visit: Payer: Self-pay

## 2019-02-05 VITALS — BP 137/75 | HR 89 | Temp 98.1°F | Resp 20

## 2019-02-05 DIAGNOSIS — N179 Acute kidney failure, unspecified: Secondary | ICD-10-CM | POA: Diagnosis present

## 2019-02-05 LAB — POCT HEMOGLOBIN-HEMACUE: Hemoglobin: 10.8 g/dL — ABNORMAL LOW (ref 13.0–17.0)

## 2019-02-05 MED ORDER — EPOETIN ALFA-EPBX 10000 UNIT/ML IJ SOLN: 5000.0000 [IU] | INTRAMUSCULAR | Status: DC

## 2019-02-05 MED ORDER — EPOETIN ALFA-EPBX 2000 UNIT/ML IJ SOLN
2000.0000 [IU] | Freq: Once | INTRAMUSCULAR | Status: AC
  Administered 2019-02-05: 2000 [IU] via SUBCUTANEOUS
  Filled 2019-02-05: qty 1

## 2019-02-05 MED ORDER — EPOETIN ALFA-EPBX 3000 UNIT/ML IJ SOLN
3000.0000 [IU] | Freq: Once | INTRAMUSCULAR | Status: AC
  Administered 2019-02-05: 3000 [IU] via SUBCUTANEOUS
  Filled 2019-02-05: qty 1

## 2019-02-11 ENCOUNTER — Other Ambulatory Visit: Payer: Self-pay

## 2019-02-11 ENCOUNTER — Encounter (INDEPENDENT_AMBULATORY_CARE_PROVIDER_SITE_OTHER): Payer: Self-pay | Admitting: Primary Care

## 2019-02-11 ENCOUNTER — Ambulatory Visit (INDEPENDENT_AMBULATORY_CARE_PROVIDER_SITE_OTHER): Payer: BLUE CROSS/BLUE SHIELD | Admitting: Primary Care

## 2019-02-11 VITALS — BP 160/95 | HR 82 | Temp 98.0°F | Resp 16 | Wt 148.2 lb

## 2019-02-11 DIAGNOSIS — J45901 Unspecified asthma with (acute) exacerbation: Secondary | ICD-10-CM

## 2019-02-11 DIAGNOSIS — J4531 Mild persistent asthma with (acute) exacerbation: Secondary | ICD-10-CM

## 2019-02-11 DIAGNOSIS — J4520 Mild intermittent asthma, uncomplicated: Secondary | ICD-10-CM

## 2019-02-11 DIAGNOSIS — E1121 Type 2 diabetes mellitus with diabetic nephropathy: Secondary | ICD-10-CM | POA: Diagnosis not present

## 2019-02-11 DIAGNOSIS — J4521 Mild intermittent asthma with (acute) exacerbation: Secondary | ICD-10-CM

## 2019-02-11 LAB — GLUCOSE, POCT (MANUAL RESULT ENTRY): POC Glucose: 94 mg/dl (ref 70–99)

## 2019-02-11 MED ORDER — BUDESONIDE-FORMOTEROL FUMARATE 160-4.5 MCG/ACT IN AERO: 2.0000 | INHALATION_SPRAY | Freq: Two times a day (BID) | RESPIRATORY_TRACT | 3 refills | Status: AC

## 2019-02-11 MED ORDER — ALBUTEROL SULFATE HFA 108 (90 BASE) MCG/ACT IN AERS: 1.0000 | INHALATION_SPRAY | Freq: Four times a day (QID) | RESPIRATORY_TRACT | 2 refills | Status: AC | PRN

## 2019-02-11 NOTE — Progress Notes (Signed)
Having problems breathing - all the time -sleeping

## 2019-02-11 NOTE — Patient Instructions (Signed)
Asthma, Adult    Asthma is a long-term (chronic) condition in which the airways get tight and narrow. The airways are the breathing passages that lead from the nose and mouth down into the lungs. A person with asthma will have times when symptoms get worse. These are called asthma attacks. They can cause coughing, whistling sounds when you breathe (wheezing), shortness of breath, and chest pain. They can make it hard to breathe. There is no cure for asthma, but medicines and lifestyle changes can help control it.  There are many things that can bring on an asthma attack or make asthma symptoms worse (triggers). Common triggers include:  · Mold.  · Dust.  · Cigarette smoke.  · Cockroaches.  · Things that can cause allergy symptoms (allergens). These include animal skin flakes (dander) and pollen from trees or grass.  · Things that pollute the air. These may include household cleaners, wood smoke, smog, or chemical odors.  · Cold air, weather changes, and wind.  · Crying or laughing hard.  · Stress.  · Certain medicines or drugs.  · Certain foods such as dried fruit, potato chips, and grape juice.  · Infections, such as a cold or the flu.  · Certain medical conditions or diseases.  · Exercise or tiring activities.  Asthma may be treated with medicines and by staying away from the things that cause asthma attacks. Types of medicines may include:  · Controller medicines. These help prevent asthma symptoms. They are usually taken every day.  · Fast-acting reliever or rescue medicines. These quickly relieve asthma symptoms. They are used as needed and provide short-term relief.  · Allergy medicines if your attacks are brought on by allergens.  · Medicines to help control the body's defense (immune) system.  Follow these instructions at home:  Avoiding triggers in your home  · Change your heating and air conditioning filter often.  · Limit your use of fireplaces and wood stoves.  · Get rid of pests (such as roaches and  mice) and their droppings.  · Throw away plants if you see mold on them.  · Clean your floors. Dust regularly. Use cleaning products that do not smell.  · Have someone vacuum when you are not home. Use a vacuum cleaner with a HEPA filter if possible.  · Replace carpet with wood, tile, or vinyl flooring. Carpet can trap animal skin flakes and dust.  · Use allergy-proof pillows, mattress covers, and box spring covers.  · Wash bed sheets and blankets every week in hot water. Dry them in a dryer.  · Keep your bedroom free of any triggers.   · Avoid pets and keep windows closed when things that cause allergy symptoms are in the air.  · Use blankets that are made of polyester or cotton.  · Clean bathrooms and kitchens with bleach. If possible, have someone repaint the walls in these rooms with mold-resistant paint. Keep out of the rooms that are being cleaned and painted.  · Wash your hands often with soap and water. If soap and water are not available, use hand sanitizer.  · Do not allow anyone to smoke in your home.  General instructions  · Take over-the-counter and prescription medicines only as told by your doctor.  ? Talk with your doctor if you have questions about how or when to take your medicines.  ? Make note if you need to use your medicines more often than usual.  · Do not use any products that   contain nicotine or tobacco, such as cigarettes and e-cigarettes. If you need help quitting, ask your doctor.  · Stay away from secondhand smoke.  · Avoid doing things outdoors when allergen counts are high and when air quality is low.  · Wear a ski mask when doing outdoor activities in the winter. The mask should cover your nose and mouth. Exercise indoors on cold days if you can.  · Warm up before you exercise. Take time to cool down after exercise.  · Use a peak flow meter as told by your doctor. A peak flow meter is a tool that measures how well the lungs are working.  · Keep track of the peak flow meter's readings.  Write them down.  · Follow your asthma action plan. This is a written plan for taking care of your asthma and treating your attacks.  · Make sure you get all the shots (vaccines) that your doctor recommends. Ask your doctor about a flu shot and a pneumonia shot.  · Keep all follow-up visits as told by your doctor. This is important.  Contact a doctor if:  · You have wheezing, shortness of breath, or a cough even while taking medicine to prevent attacks.  · The mucus you cough up (sputum) is thicker than usual.  · The mucus you cough up changes from clear or white to yellow, green, gray, or bloody.  · You have problems from the medicine you are taking, such as:  ? A rash.  ? Itching.  ? Swelling.  ? Trouble breathing.  · You need reliever medicines more than 2-3 times a week.  · Your peak flow reading is still at 50-79% of your personal best after following the action plan for 1 hour.  · You have a fever.  Get help right away if:  · You seem to be worse and are not responding to medicine during an asthma attack.  · You are short of breath even at rest.  · You get short of breath when doing very little activity.  · You have trouble eating, drinking, or talking.  · You have chest pain or tightness.  · You have a fast heartbeat.  · Your lips or fingernails start to turn blue.  · You are light-headed or dizzy, or you faint.  · Your peak flow is less than 50% of your personal best.  · You feel too tired to breathe normally.  Summary  · Asthma is a long-term (chronic) condition in which the airways get tight and narrow. An asthma attack can make it hard to breathe.  · Asthma cannot be cured, but medicines and lifestyle changes can help control it.  · Make sure you understand how to avoid triggers and how and when to use your medicines.  This information is not intended to replace advice given to you by your health care provider. Make sure you discuss any questions you have with your health care provider.  Document  Released: 05/01/2008 Document Revised: 12/18/2016 Document Reviewed: 12/18/2016  Elsevier Interactive Patient Education © 2019 [employer]

## 2019-02-11 NOTE — Progress Notes (Signed)
Acute Office Visit  Subjective:    Patient ID: Keith Maynard, male    DOB: [birth year], 28 y.o.   MRN: [id number]  Chief Complaint  Patient presents with  . Shortness of Breath    HPI Patient is in today for asthma exacerbation. PMH:  He is seen by nephrology Dr. Justin Mend weekly for a injection for anemia . He has a  medical history of asthma, DM2,and HTNpresentswith need for a glucometer. Last A1c 5.4% today A1C 5.7%.  Past Medical History:  Diagnosis Date  . Asthma   . Diabetes mellitus without complication ([place])   . Hypertension     Past Surgical History:  Procedure Laterality Date  . EYE SURGERY      Family History  Problem Relation Age of Onset  . Diabetes Mother   . Stroke Father     Social History   Socioeconomic History  . Marital status: Single    Spouse name: Not on file  . Number of children: Not on file  . Years of education: Not on file  . Highest education level: Not on file  Occupational History  . Not on file  Social Needs  . Financial resource strain: Not on file  . Food insecurity:    Worry: Not on file    Inability: Not on file  . Transportation needs:    Medical: Not on file    Non-medical: Not on file  Tobacco Use  . Smoking status: Former Smoker    Types: Cigarettes    Last attempt to quit: 01/02/2018    Years since quitting: 1.1  . Smokeless tobacco: Never Used  Substance and Sexual Activity  . Alcohol use: Yes    Alcohol/week: 0.0 standard drinks  . Drug use: No  . Sexual activity: Not Currently  Lifestyle  . Physical activity:    Days per week: Not on file    Minutes per session: Not on file  . Stress: Not on file  Relationships  . Social connections:    Talks on phone: Not on file    Gets together: Not on file    Attends religious service: Not on file    Active member of club or organization: Not on file    Attends meetings of clubs or organizations: Not on file    Relationship status: Not on file  . Intimate  partner violence:    Fear of current or ex partner: Not on file    Emotionally abused: Not on file    Physically abused: Not on file    Forced sexual activity: Not on file  Other Topics Concern  . Not on file  Social History Narrative  . Not on file    Outpatient Medications Prior to Visit  Medication Sig Dispense Refill  . amLODipine (NORVASC) 10 MG tablet Take 1 tablet (10 mg total) by mouth daily. 90 tablet 0  . atorvastatin (LIPITOR) 40 MG tablet Take 1 tablet (40 mg total) by mouth daily. 90 tablet 3  . carvedilol (COREG) 6.25 MG tablet Take 1 tablet (6.25 mg total) by mouth 2 (two) times daily with a meal. 60 tablet 5  . glimepiride (AMARYL) 2 MG tablet Take 1 tablet (2 mg total) by mouth daily before breakfast. 30 tablet 5  . losartan (COZAAR) 50 MG tablet Take 1 tablet (50 mg total) by mouth daily. 90 tablet 3  . timolol (TIMOPTIC) 0.5 % ophthalmic solution Place 1 drop into the right eye 2 (two) times daily.    Marland Kitchen  albuterol (PROVENTIL HFA;VENTOLIN HFA) 108 (90 Base) MCG/ACT inhaler Inhale 2 puffs into the lungs every 4 (four) hours as needed for up to 30 days for wheezing or shortness of breath. 1 Inhaler 2  . Blood Glucose Monitoring Suppl (ONETOUCH VERIO) w/Device KIT Use as directed once daily to test blood sugar. 1 kit 0  . CELLCEPT 250 MG capsule Take 250 mg by mouth.    . furosemide (LASIX) 40 MG tablet Take 1 tablet (40 mg total) by mouth daily as needed. (Patient not taking: Reported on 02/11/2019) 30 tablet 3  . gentamicin (GARAMYCIN) 0.3 % ophthalmic solution Place 1 drop into the right eye 4 (four) times daily.     Marland Kitchen glucose blood (ONETOUCH VERIO) test strip Use as directed once daily to test blood sugar. 100 each 12  . ONETOUCH DELICA LANCETS 25W MISC Use as directed once daily to test blood sugar. 100 each 12  . TRUEPLUS LANCETS 33G MISC Inject 1 each into the skin 2 (two) times daily. (Patient not taking: Reported on 01/22/2019) 60 each 5   No facility-administered  medications prior to visit.     Allergies  Allergen Reactions  . Other Shortness Of Breath    All nuts except peanuts-itching,nausea    Review of Systems  Constitutional: Negative.   HENT: Negative.   Eyes: Negative.   Respiratory: Positive for shortness of breath.   Cardiovascular: Negative.   Gastrointestinal: Negative.   Genitourinary: Negative.   Musculoskeletal: Negative.   Skin: Negative.   Neurological: Negative.   Endo/Heme/Allergies: Negative.   Psychiatric/Behavioral: Negative.        Objective:    Physical Exam  Constitutional: He is oriented to person, place, and time. He appears well-developed.  HENT:  Head: Normocephalic.  Cardiovascular: Normal rate and regular rhythm.  Pulmonary/Chest: He has wheezes.  Neurological: He is alert and oriented to person, place, and time.  Psychiatric: He has a normal mood and affect.    BP (!) 160/95 (BP Location: Right Arm, Patient Position: Sitting, Cuff Size: Normal)   Pulse 82   Temp 98 F (36.7 C) (Oral)   Resp 16   Wt 148 lb 3.2 oz (67.2 kg)   SpO2 100%   BMI 23.21 kg/m  Wt Readings from Last 3 Encounters:  02/11/19 148 lb 3.2 oz (67.2 kg)  01/22/19 148 lb 9.6 oz (67.4 kg)  07/11/18 123 lb 6.4 oz (56 kg)    Health Maintenance Due  Topic Date Due  . FOOT EXAM  10/12/2001  . OPHTHALMOLOGY EXAM  10/12/2001    There are no preventive care reminders to display for this patient.   Lab Results  Component Value Date   TSH 1.738 06/15/2018   Lab Results  Component Value Date   WBC 5.8 07/11/2018   HGB 10.8 (L) 02/05/2019   HCT 29.5 (L) 07/11/2018   MCV 88 07/11/2018   PLT 348 07/11/2018   Lab Results  Component Value Date   NA 144 07/11/2018   K 5.7 (H) 07/11/2018   CO2 20 07/11/2018   GLUCOSE 149 (H) 07/11/2018   BUN 36 (H) 07/11/2018   CREATININE 2.66 (H) 07/11/2018   BILITOT 0.6 06/15/2018   ALKPHOS 71 06/15/2018   AST 15 06/15/2018   ALT 18 06/15/2018   PROT 5.9 (L) 06/15/2018    ALBUMIN 2.9 (L) 06/15/2018   CALCIUM 9.5 07/11/2018   ANIONGAP 5 06/18/2018   Lab Results  Component Value Date   CHOL 284 (H) 07/11/2018  Lab Results  Component Value Date   HDL 78 07/11/2018   Lab Results  Component Value Date   LDLCALC 182 (H) 07/11/2018   Lab Results  Component Value Date   TRIG 121 07/11/2018   Lab Results  Component Value Date   CHOLHDL 3.6 07/11/2018   Lab Results  Component Value Date   HGBA1C 5.7 (A) 01/22/2019       Assessment & Plan:   Problem List Items Addressed This Visit    Asthma   Relevant Medications   albuterol (PROVENTIL HFA;VENTOLIN HFA) 108 (90 Base) MCG/ACT inhaler   budesonide-formoterol (SYMBICORT) 160-4.5 MCG/ACT inhaler   Other Relevant Orders   Ambulatory referral to Pulmonology   Pulmonary function test    Other Visit Diagnoses    Type 2 diabetes mellitus with diabetic nephropathy, without long-term current use of insulin (HCC)    -  Primary   Relevant Orders   Glucose (CBG) (Completed)       Meds ordered this encounter  Medications  . albuterol (PROVENTIL HFA;VENTOLIN HFA) 108 (90 Base) MCG/ACT inhaler    Sig: Inhale 1-2 puffs into the lungs every 6 (six) hours as needed for up to 30 days for wheezing or shortness of breath.    Dispense:  1 Inhaler    Refill:  2  . budesonide-formoterol (SYMBICORT) 160-4.5 MCG/ACT inhaler    Sig: Inhale 2 puffs into the lungs 2 (two) times daily.    Dispense:  1 Inhaler    Refill:  3     Kerin Perna, NP

## 2019-02-12 ENCOUNTER — Ambulatory Visit (HOSPITAL_COMMUNITY)
Admission: RE | Admit: 2019-02-12 | Discharge: 2019-02-12 | Disposition: A | Discharge status: Home / Self Care | Payer: BLUE CROSS/BLUE SHIELD | Source: Ambulatory Visit | Attending: Nephrology | Admitting: Nephrology

## 2019-02-12 VITALS — BP 141/88 | HR 85 | Temp 98.2°F | Resp 20

## 2019-02-12 DIAGNOSIS — N179 Acute kidney failure, unspecified: Secondary | ICD-10-CM

## 2019-02-12 LAB — POCT HEMOGLOBIN-HEMACUE: Hemoglobin: 9.2 g/dL — ABNORMAL LOW (ref 13.0–17.0)

## 2019-02-12 MED ORDER — EPOETIN ALFA-EPBX 10000 UNIT/ML IJ SOLN: 5000.0000 [IU] | INTRAMUSCULAR | Status: DC

## 2019-02-12 MED ORDER — EPOETIN ALFA-EPBX 3000 UNIT/ML IJ SOLN
3000.0000 [IU] | Freq: Once | INTRAMUSCULAR | Status: AC
  Administered 2019-02-12: 3000 [IU] via SUBCUTANEOUS
  Filled 2019-02-12: qty 1

## 2019-02-12 MED ORDER — EPOETIN ALFA-EPBX 2000 UNIT/ML IJ SOLN
2000.0000 [IU] | Freq: Once | INTRAMUSCULAR | Status: AC
  Administered 2019-02-12: 2000 [IU] via SUBCUTANEOUS
  Filled 2019-02-12: qty 1

## 2019-02-19 ENCOUNTER — Other Ambulatory Visit: Payer: Self-pay

## 2019-02-19 ENCOUNTER — Ambulatory Visit (HOSPITAL_COMMUNITY)
Admission: RE | Admit: 2019-02-19 | Discharge: 2019-02-19 | Disposition: A | Discharge status: Home / Self Care | Payer: BLUE CROSS/BLUE SHIELD | Source: Ambulatory Visit | Attending: Nephrology | Admitting: Nephrology

## 2019-02-19 VITALS — BP 143/84 | HR 87 | Temp 97.6°F | Resp 18

## 2019-02-19 DIAGNOSIS — N179 Acute kidney failure, unspecified: Secondary | ICD-10-CM | POA: Insufficient documentation

## 2019-02-19 LAB — IRON AND TIBC
Iron: 36 ug/dL — ABNORMAL LOW (ref 45–182)
Saturation Ratios: 12 % — ABNORMAL LOW (ref 17.9–39.5)
TIBC: 294 ug/dL (ref 250–450)
UIBC: 258 ug/dL

## 2019-02-19 LAB — FERRITIN: Ferritin: 96 ng/mL (ref 24–336)

## 2019-02-19 LAB — POCT HEMOGLOBIN-HEMACUE: Hemoglobin: 9.1 g/dL — ABNORMAL LOW (ref 13.0–17.0)

## 2019-02-19 MED ORDER — EPOETIN ALFA-EPBX 10000 UNIT/ML IJ SOLN
5000.0000 [IU] | INTRAMUSCULAR | Status: DC
  Administered 2019-02-19: 5000 [IU] via SUBCUTANEOUS
  Filled 2019-02-19: qty 1

## 2019-02-26 ENCOUNTER — Other Ambulatory Visit: Payer: Self-pay

## 2019-02-26 ENCOUNTER — Ambulatory Visit (HOSPITAL_COMMUNITY)
Admission: RE | Admit: 2019-02-26 | Discharge: 2019-02-26 | Disposition: A | Discharge status: Home / Self Care | Payer: BLUE CROSS/BLUE SHIELD | Source: Ambulatory Visit | Attending: Nephrology | Admitting: Nephrology

## 2019-02-26 VITALS — BP 149/80 | HR 87 | Temp 97.7°F | Resp 18

## 2019-02-26 DIAGNOSIS — N179 Acute kidney failure, unspecified: Secondary | ICD-10-CM

## 2019-02-26 LAB — POCT HEMOGLOBIN-HEMACUE: Hemoglobin: 9.6 g/dL — ABNORMAL LOW (ref 13.0–17.0)

## 2019-02-26 MED ORDER — EPOETIN ALFA-EPBX 3000 UNIT/ML IJ SOLN
3000.0000 [IU] | Freq: Once | INTRAMUSCULAR | Status: AC
  Administered 2019-02-26: 3000 [IU] via SUBCUTANEOUS
  Filled 2019-02-26: qty 1

## 2019-02-26 MED ORDER — EPOETIN ALFA-EPBX 10000 UNIT/ML IJ SOLN: 5000.0000 [IU] | INTRAMUSCULAR | Status: DC

## 2019-02-26 MED ORDER — EPOETIN ALFA-EPBX 2000 UNIT/ML IJ SOLN
2000.0000 [IU] | Freq: Once | INTRAMUSCULAR | Status: AC
  Administered 2019-02-26: 14:00:00 2000 [IU] via SUBCUTANEOUS
  Filled 2019-02-26: qty 1

## 2019-03-05 ENCOUNTER — Other Ambulatory Visit: Payer: Self-pay

## 2019-03-05 ENCOUNTER — Ambulatory Visit (HOSPITAL_COMMUNITY)
Admission: RE | Admit: 2019-03-05 | Discharge: 2019-03-05 | Disposition: A | Discharge status: Home / Self Care | Payer: BLUE CROSS/BLUE SHIELD | Source: Ambulatory Visit | Attending: Nephrology | Admitting: Nephrology

## 2019-03-05 VITALS — BP 162/92 | HR 92 | Temp 98.0°F | Resp 18

## 2019-03-05 DIAGNOSIS — N179 Acute kidney failure, unspecified: Secondary | ICD-10-CM | POA: Diagnosis not present

## 2019-03-05 LAB — POCT HEMOGLOBIN-HEMACUE: Hemoglobin: 9.3 g/dL — ABNORMAL LOW (ref 13.0–17.0)

## 2019-03-05 MED ORDER — EPOETIN ALFA-EPBX 10000 UNIT/ML IJ SOLN
10000.0000 [IU] | Freq: Once | INTRAMUSCULAR | Status: AC
  Administered 2019-03-05: 10000 [IU] via SUBCUTANEOUS
  Filled 2019-03-05: qty 1

## 2019-03-05 MED ORDER — EPOETIN ALFA-EPBX 10000 UNIT/ML IJ SOLN
5000.0000 [IU] | INTRAMUSCULAR | Status: DC
  Filled 2019-03-05: qty 1

## 2019-03-12 ENCOUNTER — Ambulatory Visit (INDEPENDENT_AMBULATORY_CARE_PROVIDER_SITE_OTHER): Payer: BLUE CROSS/BLUE SHIELD | Admitting: Primary Care

## 2019-03-12 ENCOUNTER — Ambulatory Visit: Payer: BLUE CROSS/BLUE SHIELD | Admitting: Primary Care

## 2019-03-19 ENCOUNTER — Encounter (HOSPITAL_COMMUNITY): Payer: BLUE CROSS/BLUE SHIELD

## 2019-04-02 ENCOUNTER — Encounter (HOSPITAL_COMMUNITY): Payer: BLUE CROSS/BLUE SHIELD

## 2019-04-20 IMAGING — US US RENAL
1 series · 14 of 24 positions shown · non-contrast
Comparison: None available.

CLINICAL DATA: Initial evaluation for acute renal failure.

EXAM:
RENAL / URINARY TRACT ULTRASOUND COMPLETE

[Series 1: us renal · 0.23mm/px · 14 of 24 slices shown]
[im 1/24]
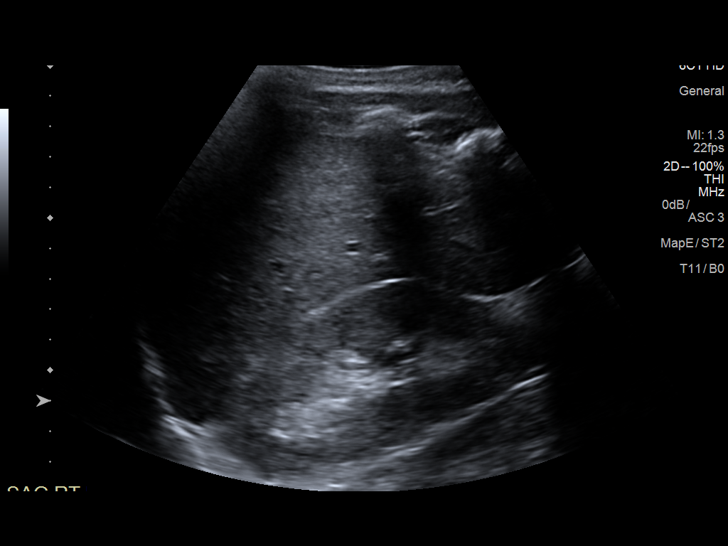
[im 3/24]
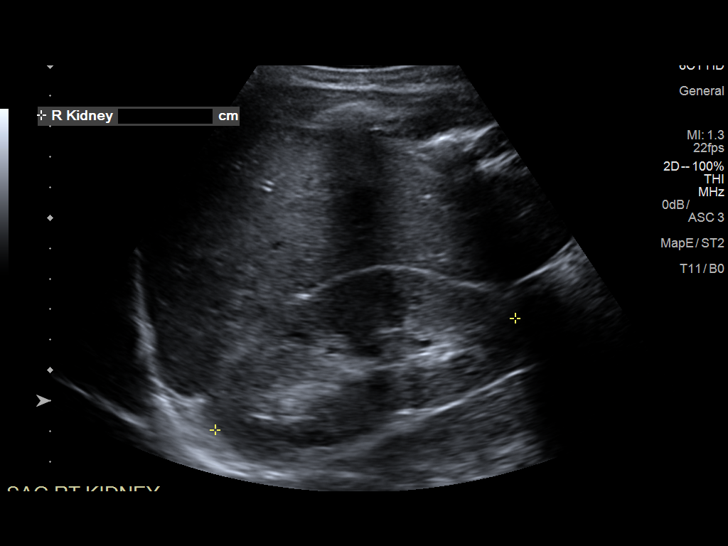
[im 5/24]
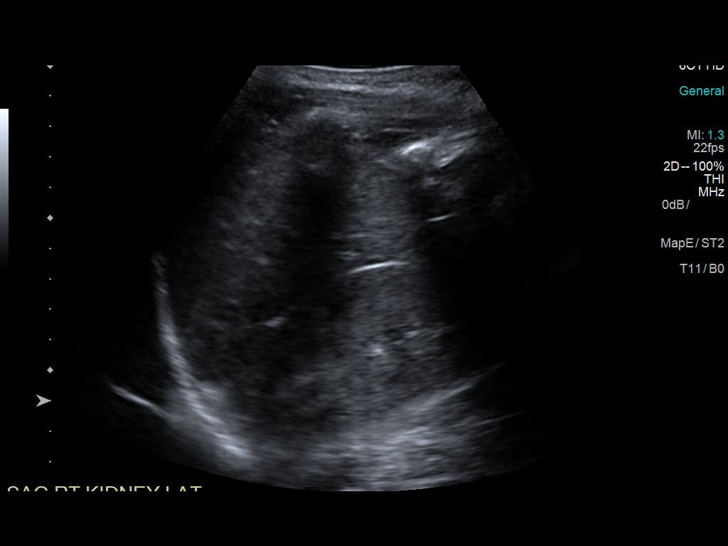
[im 7/24]
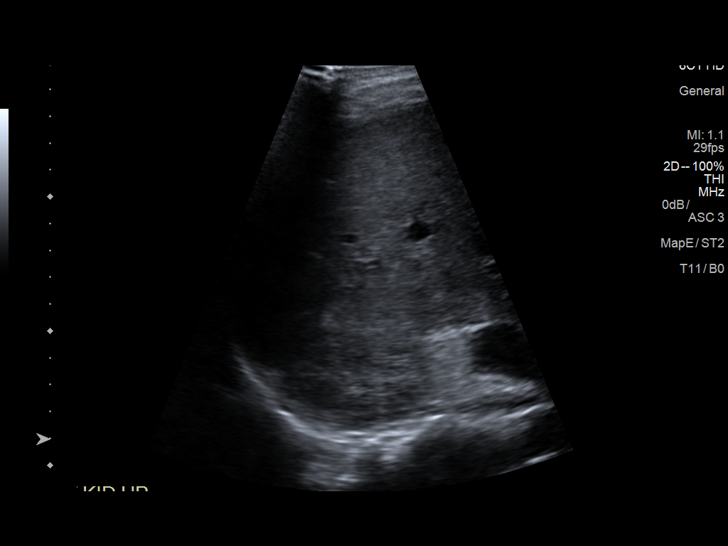
[im 8/24]
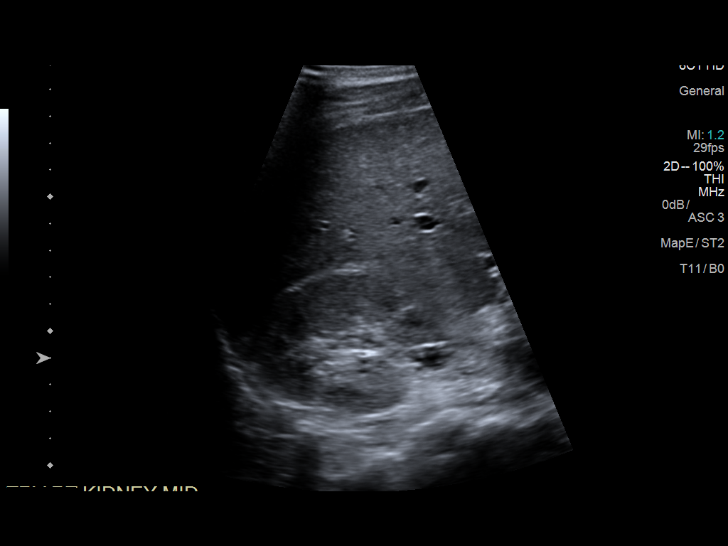
[im 10/24]
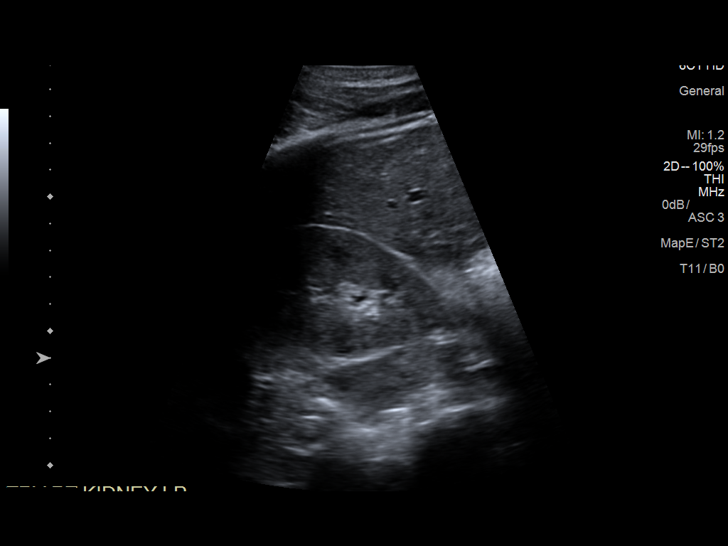
[im 12/24]
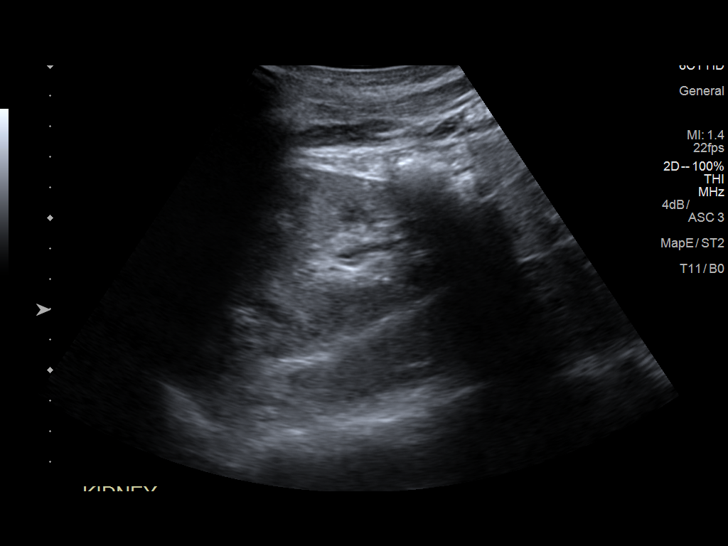
[im 13/24]
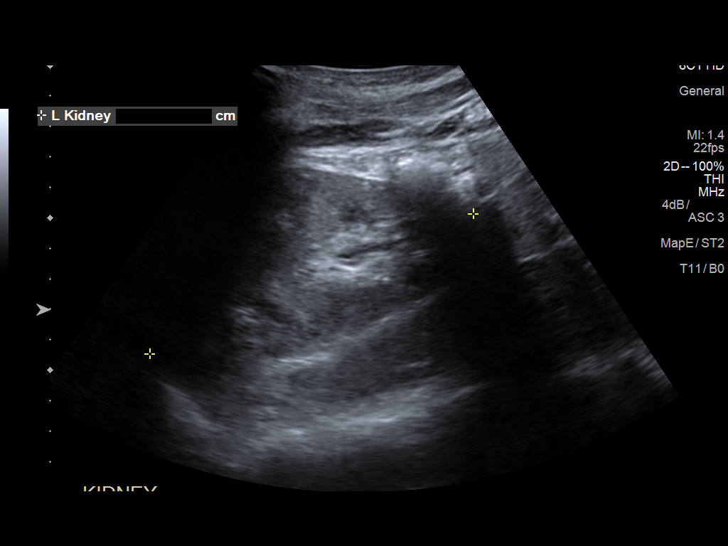
[im 15/24]
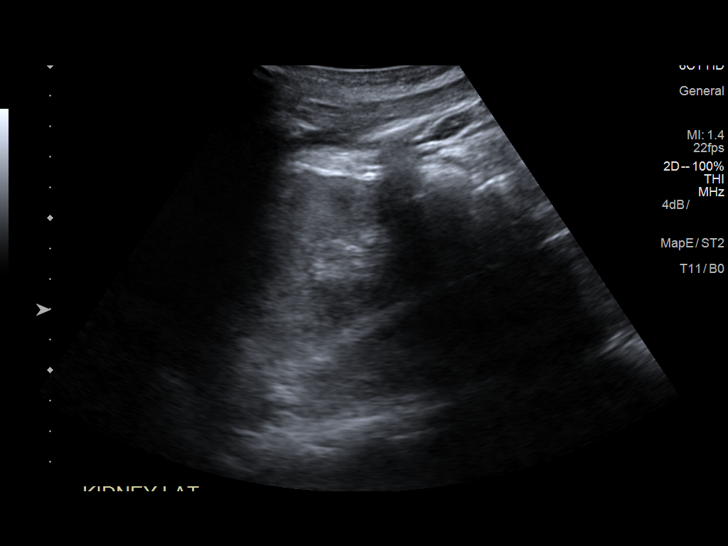
[im 17/24]
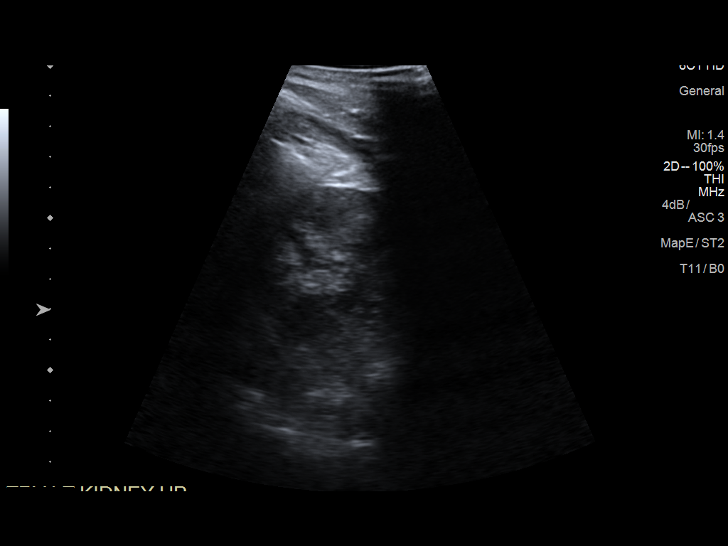
[im 19/24]
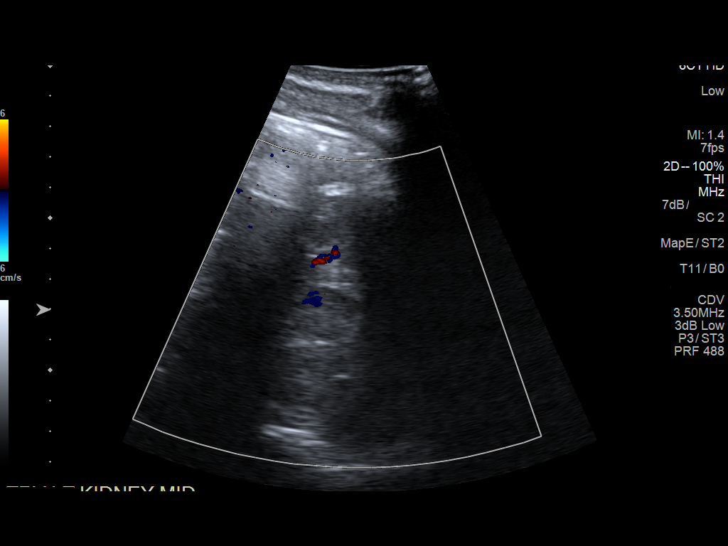
[im 20/24]
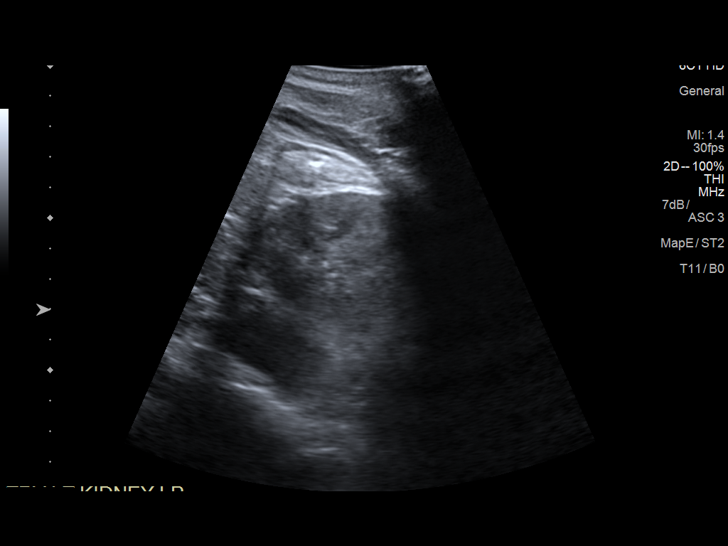
[im 22/24]
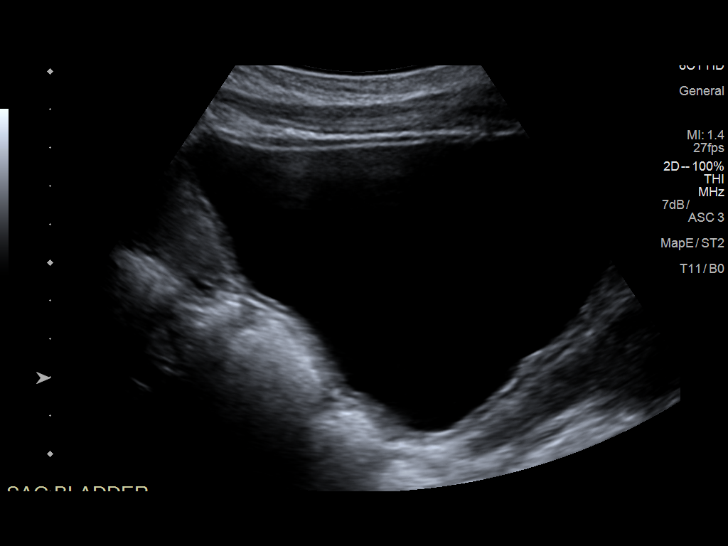
[im 24/24]
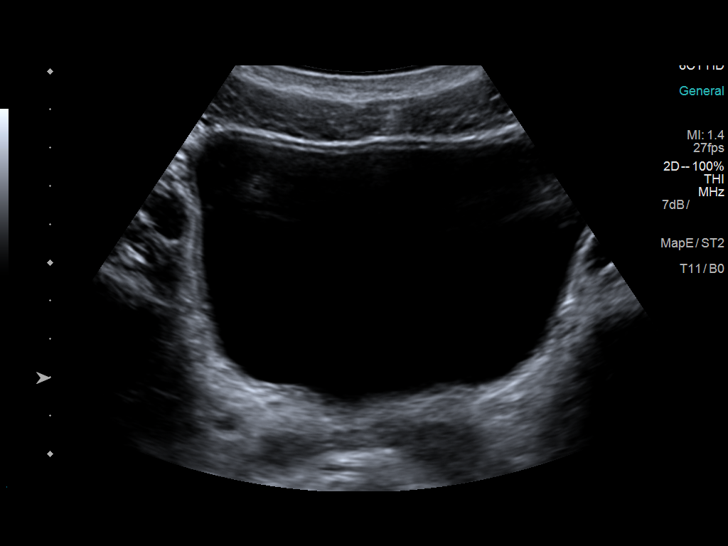

[14 of 24 positions shown; findings below may reference images not displayed]

FINDINGS: Right Kidney:

Length: 10.5 cm. Increased echogenicity within the renal parenchyma,
consistent with medical renal disease. No mass or hydronephrosis
visualized.

Left Kidney:

Length: 11.6 cm. Increased echogenicity within the renal parenchyma,
compatible with medical renal disease. No mass or hydronephrosis
visualized.

Bladder:

Appears normal for degree of bladder distention.
IMPRESSION: 1. Increased echogenicity within the renal parenchyma, compatible
with medical renal disease.
2. No hydronephrosis.

## 2019-04-23 IMAGING — US US BIOPSY
1 series · 9 of 9 positions shown · non-contrast
Comparison: None.

INDICATION: 26-year-old male with proteinuria and renal dysfunction. He presents
for ultrasound-guided random renal biopsy.

EXAM:
ULTRASOUND GUIDED RENAL BIOPSY

[Series 1: us biopsy · 0.20mm/px · 9 of 9 slices shown]
[im 1/9]
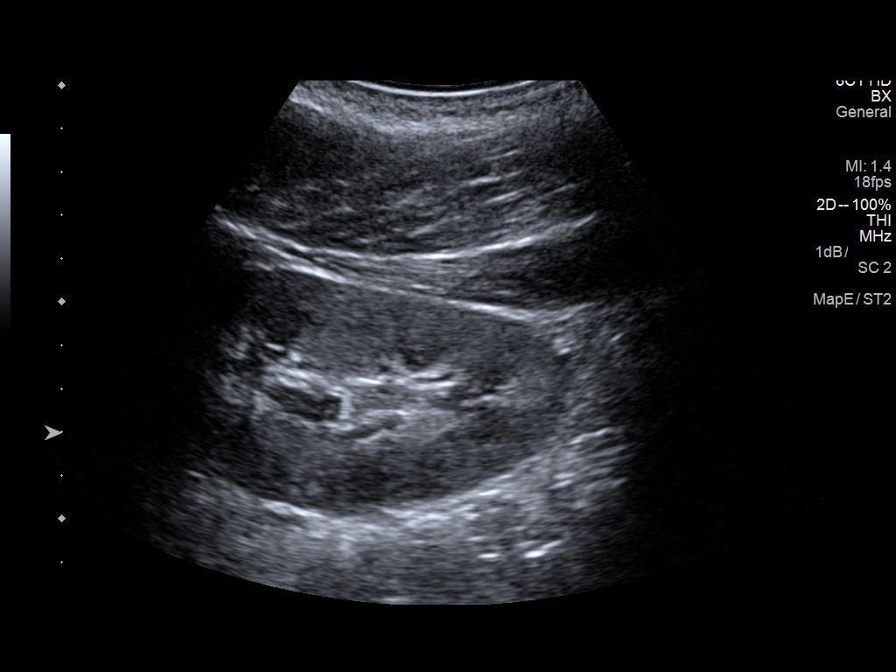
[im 2/9]
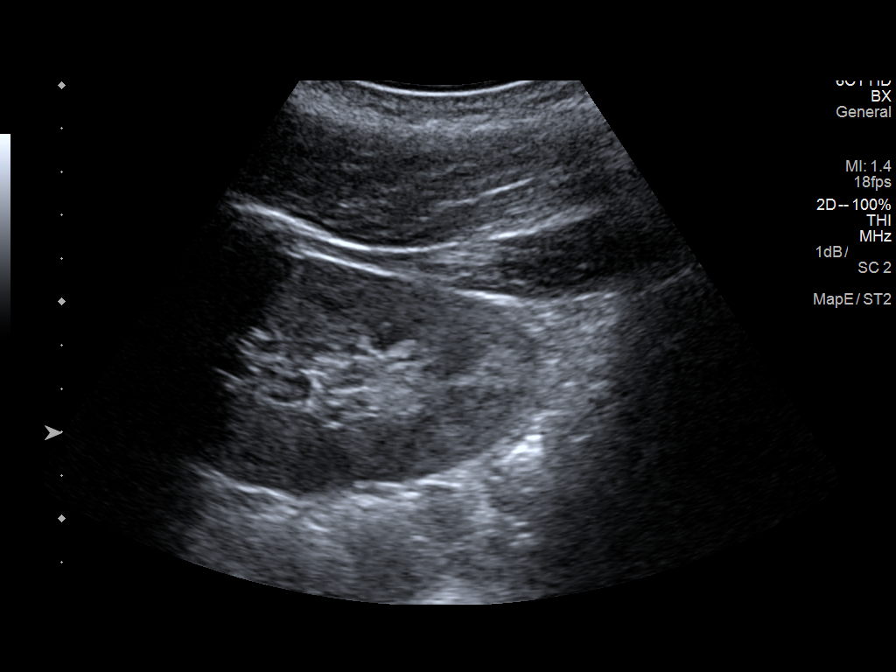
[im 3/9]
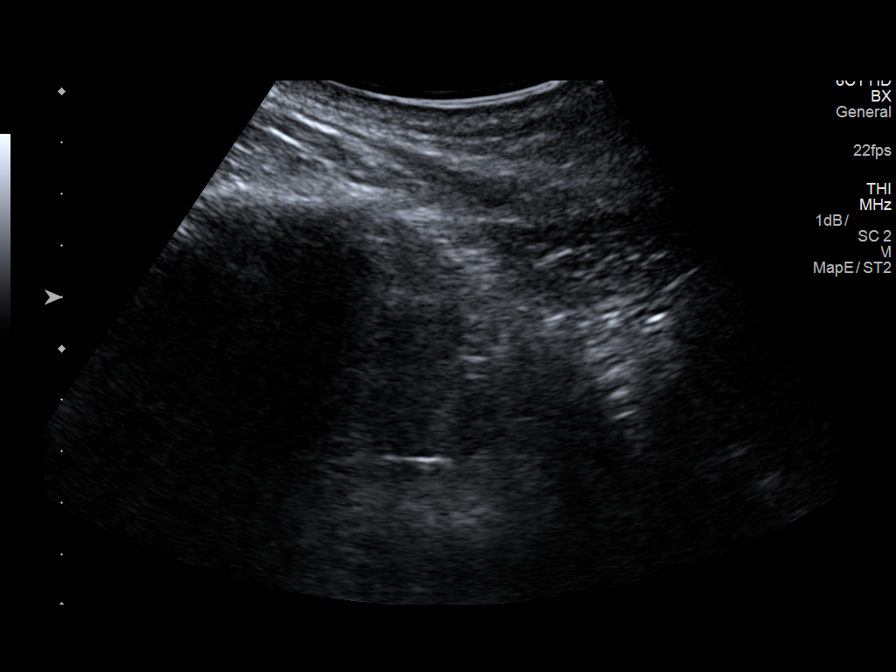
[im 4/9]
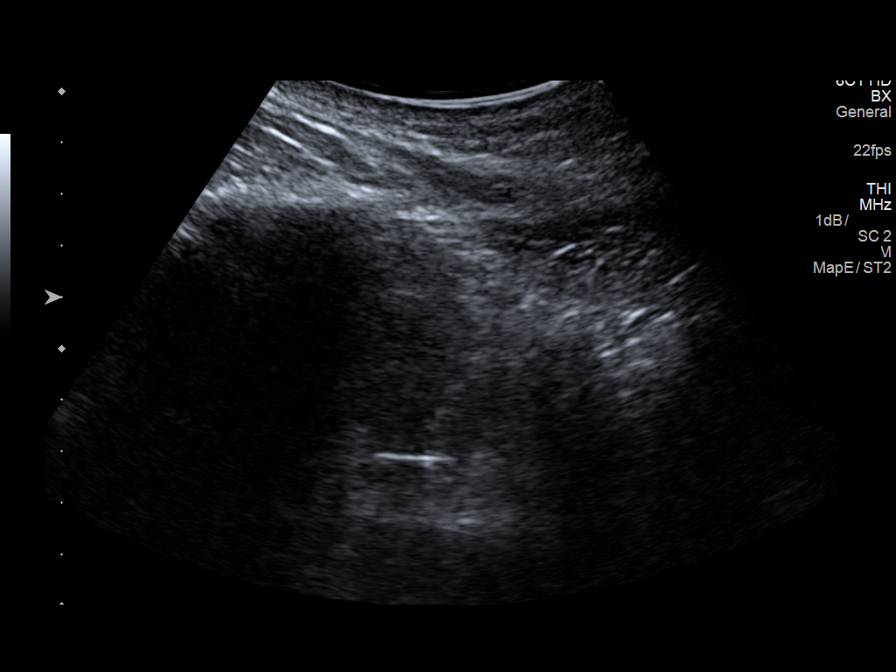
[im 5/9]
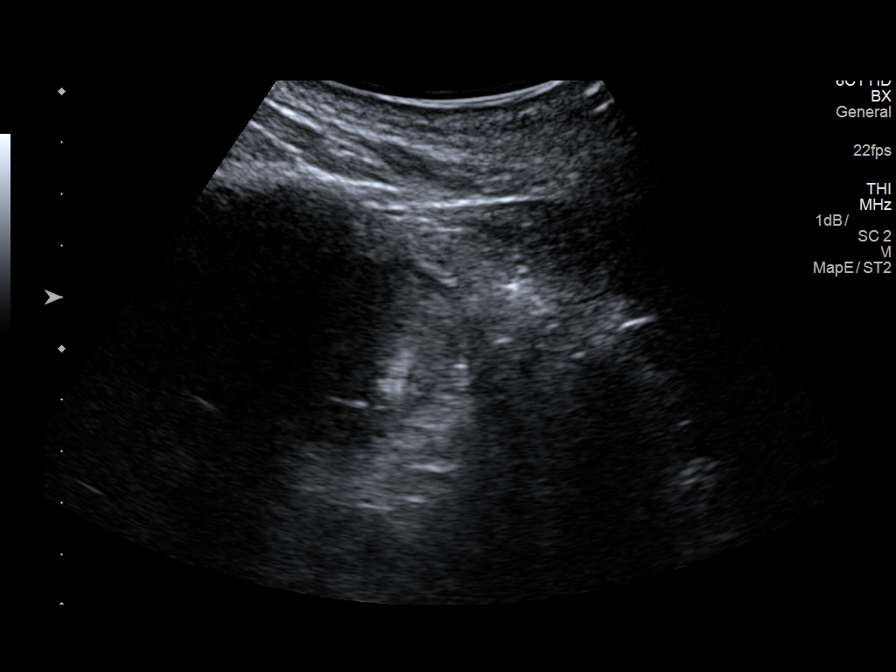
[im 6/9]
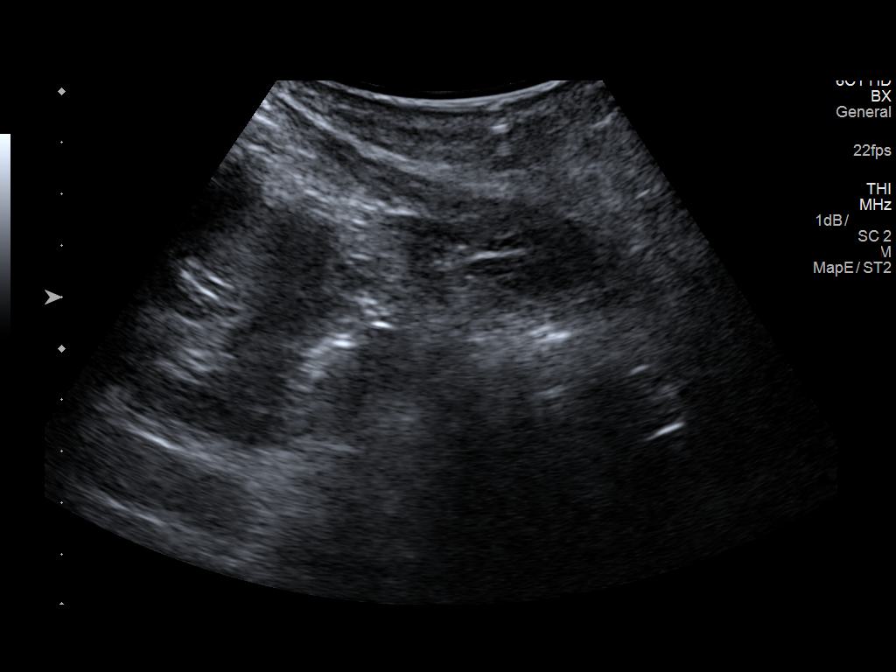
[im 7/9]
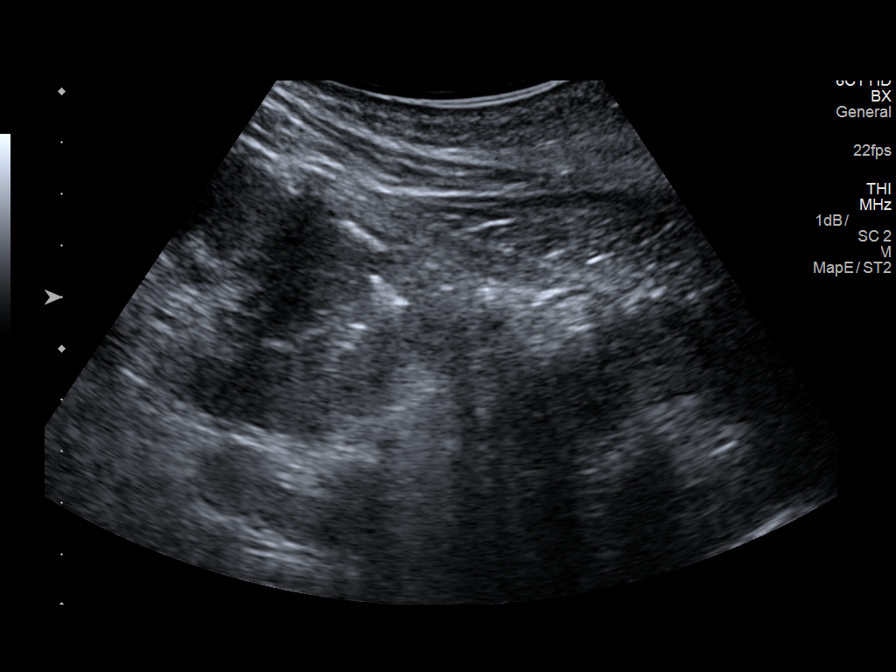
[im 8/9]
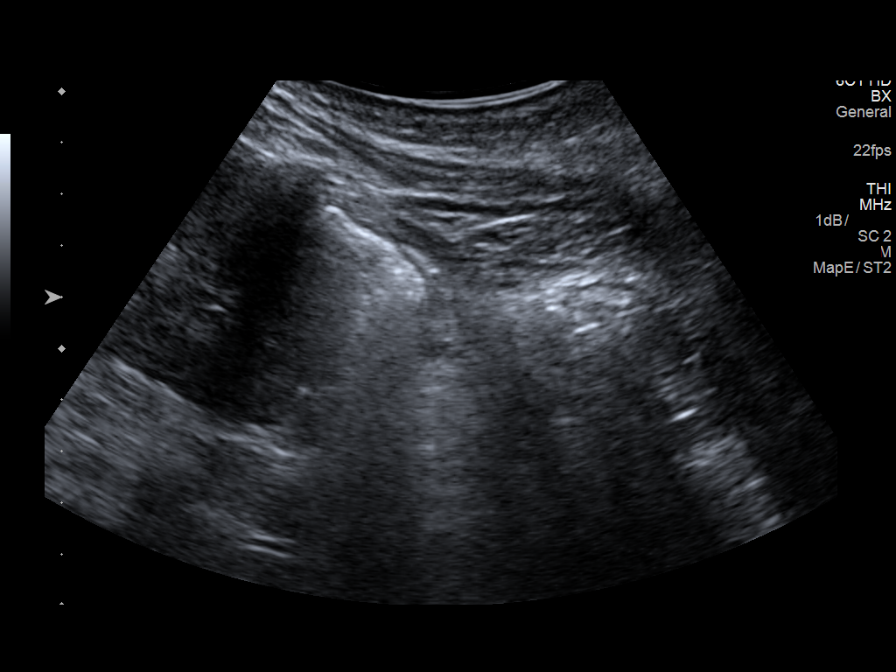
[im 9/9]
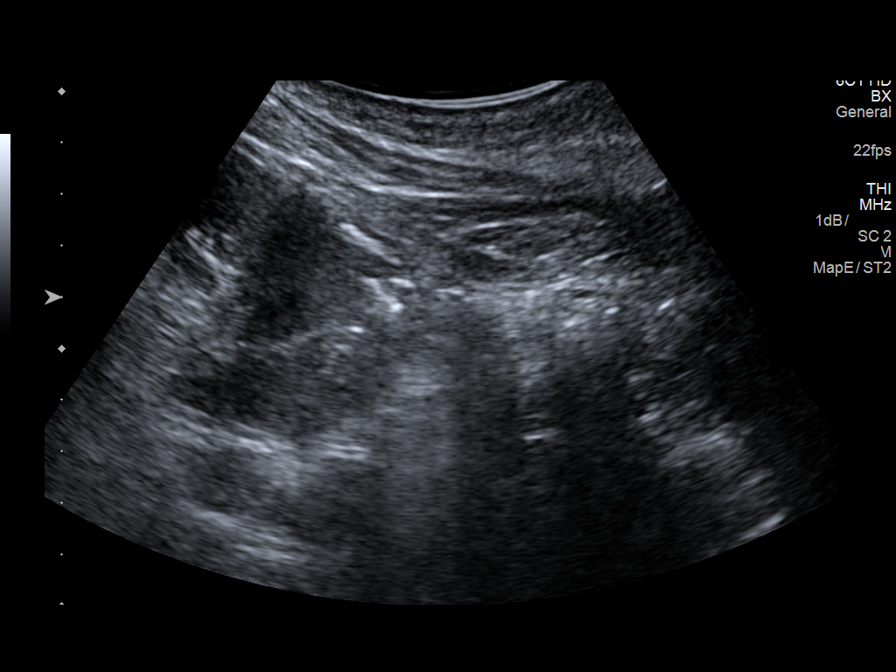

[9 of 9 positions shown; findings below may reference images not displayed]

MEDICATIONS:
Fentanyl 75 mcg IV; Versed 2 mg IV

ANESTHESIA/SEDATION:
Total Moderate Sedation time

Fifteen minutes

COMPLICATIONS:
None immediate

PROCEDURE:
Informed written consent was obtained from the patient after a
discussion of the risks, benefits and alternatives to treatment. The
patient understands and consents the procedure. A timeout was
performed prior to the initiation of the procedure.

Ultrasound scanning was performed of the bilateral flanks. The
inferior pole of the left kidney was selected for biopsy due to
location and sonographic window. The procedure was planned. The
operative site was prepped and draped in the usual sterile fashion.
The overlying soft tissues were anesthetized with 1% lidocaine with
epinephrine. A 16 gauge introducer needle was advanced into the
lower pole renal cortex. Two 17 gauge core needle biopsies were then
coaxially obtained under direct ultrasound guidance. Images were
saved for documentation purposes. The biopsy device was removed. As
the introducer needle was removed, the biopsy tract was embolized
with a Gel-Foam slurry. Post procedural scanning was negative for
significant post procedural hemorrhage or additional complication. A
dressing was placed. The patient tolerated the procedure well
without immediate post procedural complication.
IMPRESSION: Technically successful ultrasound guided left renal biopsy.

## 2019-05-13 ENCOUNTER — Other Ambulatory Visit: Payer: Self-pay | Admitting: Pharmacist

## 2019-05-13 DIAGNOSIS — E1121 Type 2 diabetes mellitus with diabetic nephropathy: Secondary | ICD-10-CM

## 2019-05-13 MED ORDER — GLIMEPIRIDE 2 MG PO TABS: 2.0000 mg | ORAL_TABLET | Freq: Every day | ORAL | 0 refills | Status: AC

## 2019-05-24 ENCOUNTER — Other Ambulatory Visit (INDEPENDENT_AMBULATORY_CARE_PROVIDER_SITE_OTHER): Payer: Self-pay | Admitting: Primary Care

## 2019-06-24 ENCOUNTER — Telehealth (INDEPENDENT_AMBULATORY_CARE_PROVIDER_SITE_OTHER): Payer: Self-pay

## 2019-06-24 NOTE — Telephone Encounter (Signed)
Office received prior auth request for glimepiride. CMA contacted patient to inform that before refills/prior authorization could be completed he needed an OV. Patient stated he is no longer residing in [place]. He has moved to [facility] and is scheduled with a new PCP there to establish care. Patient will no longer be a patient of [facility]. Nat Christen, CMA
